# Patient Record
Sex: Male | Born: 1995 | Race: White | Hispanic: No | Marital: Single | Smoking: Current every day smoker
Health system: Southern US, Community
[De-identification: ages and names within clinical notes are randomized; demographics above are authoritative.]

## PROBLEM LIST (undated history)

## (undated) HISTORY — PX: WISDOM TOOTH EXTRACTION: SHX21

## (undated) HISTORY — PX: OTHER SURGICAL HISTORY: SHX169

---

## 2018-09-18 ENCOUNTER — Emergency Department (HOSPITAL_COMMUNITY): Payer: BLUE CROSS/BLUE SHIELD

## 2018-09-18 ENCOUNTER — Emergency Department (HOSPITAL_COMMUNITY)
Admission: EM | Admit: 2018-09-18 | Discharge: 2018-09-18 | Disposition: A | Discharge status: Home / Self Care | Payer: BLUE CROSS/BLUE SHIELD | Attending: Emergency Medicine | Admitting: Emergency Medicine

## 2018-09-18 ENCOUNTER — Encounter (HOSPITAL_COMMUNITY): Payer: Self-pay | Admitting: Physician Assistant

## 2018-09-18 DIAGNOSIS — S92321A Displaced fracture of second metatarsal bone, right foot, initial encounter for closed fracture: Secondary | ICD-10-CM | POA: Insufficient documentation

## 2018-09-18 DIAGNOSIS — S92251A Displaced fracture of navicular [scaphoid] of right foot, initial encounter for closed fracture: Secondary | ICD-10-CM | POA: Diagnosis not present

## 2018-09-18 DIAGNOSIS — S99921A Unspecified injury of right foot, initial encounter: Secondary | ICD-10-CM | POA: Diagnosis present

## 2018-09-18 DIAGNOSIS — Y9364 Activity, baseball: Secondary | ICD-10-CM | POA: Diagnosis not present

## 2018-09-18 DIAGNOSIS — S92331A Displaced fracture of third metatarsal bone, right foot, initial encounter for closed fracture: Secondary | ICD-10-CM | POA: Diagnosis not present

## 2018-09-18 DIAGNOSIS — Y999 Unspecified external cause status: Secondary | ICD-10-CM | POA: Insufficient documentation

## 2018-09-18 DIAGNOSIS — Y92320 Baseball field as the place of occurrence of the external cause: Secondary | ICD-10-CM | POA: Diagnosis not present

## 2018-09-18 DIAGNOSIS — X500XXA Overexertion from strenuous movement or load, initial encounter: Secondary | ICD-10-CM | POA: Diagnosis not present

## 2018-09-18 MED ORDER — ONDANSETRON HCL 4 MG/2ML IJ SOLN: 4.0000 mg | Freq: Once | INTRAMUSCULAR | Status: DC

## 2018-09-18 MED ORDER — ONDANSETRON HCL 4 MG PO TABS
4.0000 mg | ORAL_TABLET | Freq: Once | ORAL | Status: AC
  Administered 2018-09-18: 4 mg via ORAL
  Filled 2018-09-18: qty 1

## 2018-09-18 MED ORDER — OXYCODONE-ACETAMINOPHEN 5-325 MG PO TABS: 1.0000 | ORAL_TABLET | Freq: Four times a day (QID) | ORAL | 0 refills | Status: DC | PRN

## 2018-09-18 MED ORDER — ONDANSETRON 4 MG PO TBDP: 4.0000 mg | ORAL_TABLET | Freq: Three times a day (TID) | ORAL | 0 refills | Status: DC | PRN

## 2018-09-18 MED ORDER — OXYCODONE-ACETAMINOPHEN 5-325 MG PO TABS
1.0000 | ORAL_TABLET | Freq: Once | ORAL | Status: AC
  Administered 2018-09-18: 1 via ORAL
  Filled 2018-09-18: qty 1

## 2018-09-18 NOTE — ED Triage Notes (Signed)
Patient was playing base ball while he was running he fell on his foot.

## 2018-09-18 NOTE — Discharge Instructions (Addendum)
Please wear your splint at all times.  You may take it off only to shower and must be very careful not to put any weight on the foot at all times, especially when not wearing it to shower.  If you put weight on your foot it can be made worse.    Please take Ibuprofen (Advil, motrin) and Tylenol (acetaminophen) to relieve your pain.  You may take up to 600 MG (3 pills) of normal strength ibuprofen every 8 hours as needed.  In between doses of ibuprofen you make take tylenol, up to 1,000 mg (two extra strength pills).  Do not take more than 3,000 mg tylenol in a 24 hour period.  Please check all medication labels as many medications such as pain and cold medications may contain tylenol.  Do not drink alcohol while taking these medications.  Do not take other NSAID'S while taking ibuprofen (such as aleve or naproxen).  Please take ibuprofen with food to decrease stomach upset.  Today you received medications that may make you sleepy or impair your ability to make decisions.  For the next 24 hours please do not drive, operate heavy machinery, care for a small child with out another adult present, or perform any activities that may cause harm to you or someone else if you were to fall asleep or be impaired.   You are being prescribed a medication which may make you sleepy. Please follow up of listed precautions for at least 24 hours after taking one dose.  You may want to consider taking 1 baby aspirin (81 mg) once or twice a day to help decrease your risk of blood clots in your leg.  Please make sure that you let your surgeon know you have been taking aspirin.

## 2018-09-18 NOTE — ED Provider Notes (Signed)
[facility]-EMERGENCY DEPT Provider Note   CSN: [id number] Arrival date & time: 09/18/18  1202     History   Chief Complaint No chief complaint on file.   HPI Peter Holt is a 22 y.o. male who presents today for evaluation of a right foot injury.  He reports that he was playing baseball and rounding first base when he stepped funny and felt something crack in his foot.  He denies striking his head or passing out, no other injuries from this incident.  He has not injured this foot or ankle in the past.  He denies any numbness or tingling.  No interventions tried prior to arrival.  HPI  History reviewed. No pertinent past medical history.  There are no active problems to display for this patient.   History reviewed. No pertinent surgical history.      Home Medications    Prior to Admission medications   Medication Sig Start Date End Date Taking? Authorizing Provider  ondansetron (ZOFRAN ODT) 4 MG disintegrating tablet Take 1 tablet (4 mg total) by mouth every 8 (eight) hours as needed for nausea or vomiting. 09/18/18   Cristina Gong, PA-C  oxyCODONE-acetaminophen (PERCOCET/ROXICET) 5-325 MG tablet Take 1 tablet by mouth every 6 (six) hours as needed for severe pain. 09/18/18   Cristina Gong, PA-C    Family History History reviewed. No pertinent family history.  Social History Social History   Tobacco Use  . Smoking status: Not on file  Substance Use Topics  . Alcohol use: Not on file  . Drug use: Not on file     Allergies   Penicillins   Review of Systems Review of Systems  Constitutional: Negative for chills and fever.  HENT: Negative for congestion.   Respiratory: Negative for shortness of breath.   Musculoskeletal: Positive for joint swelling.       Right foot pain  Neurological: Negative for headaches.  All other systems reviewed and are negative.    Physical Exam Updated Vital Signs BP 121/80   Pulse 81   Temp  98.6 F (37 C) (Oral)   Resp 18   Ht 5\' 9"  (1.753 m)   Wt 90.7 kg   SpO2 100%   BMI 29.53 kg/m   Physical Exam  Constitutional: He appears well-developed. No distress.  HENT:  Head: Normocephalic.  Cardiovascular:  2+ PT pulses bilaterally, 2+ left DP pulse, unable to palpate right DP pulse secondary to edema, pain  Musculoskeletal:  Obvious deformity to right foot over the dorsum and medial aspects.  There is a large amount of edema without obvious ecchymosis.  He is able to wiggle his toes.  Neurological: He is alert.  Sensation intact to bilateral feet.  Skin: Skin is warm and dry. He is not diaphoretic.  No obvious breaks in the skin over the right foot  Nursing note and vitals reviewed.    ED Treatments / Results  Labs (all labs ordered are listed, but only abnormal results are displayed) Labs Reviewed - No data to display  EKG None  Radiology Dg Ankle Complete Right  Result Date: 09/18/2018 CLINICAL DATA:  Injured playing baseball.  Pain. EXAM: RIGHT ANKLE - COMPLETE 3+ VIEW COMPARISON:  None. FINDINGS: Comminuted fracture of medial aspect of the navicular. Medial subluxation of medial cuneiform relative to the navicular. No other fracture or dislocation. Soft tissue swelling along the dorsal aspect of the midfoot. IMPRESSION: 1. Comminuted fracture of medial aspect of the navicular. Medial subluxation  of medial cuneiform relative to the navicular. Electronically Signed   By: Elige Ko   On: 09/18/2018 13:32   Dg Foot Complete Right  Result Date: 09/18/2018 CLINICAL DATA:  Deformity of the foot. Pain and swelling. Injured at baseball game. EXAM: RIGHT FOOT COMPLETE - 3+ VIEW COMPARISON:  None. FINDINGS: Acute displaced fractures of the right second and third metatarsal necks with medial displacement. Comminuted fracture of medial aspect of the navicular. Medial subluxation of medial cuneiform relative to the navicular. No other fracture or dislocation. Soft tissue  swelling along the dorsal aspect of the midfoot. IMPRESSION: 1. Acute displaced fractures of the right second and third metatarsal necks with medial displacement. 2. Comminuted fracture of medial aspect of the navicular. Medial subluxation of medial cuneiform relative to the navicular. Electronically Signed   By: Elige Ko   On: 09/18/2018 13:30    Procedures Procedures (including critical care time)  Medications Ordered in ED Medications  oxyCODONE-acetaminophen (PERCOCET/ROXICET) 5-325 MG per tablet 1 tablet (1 tablet Oral Given 09/18/18 1313)  ondansetron (ZOFRAN) tablet 4 mg (4 mg Oral Given 09/18/18 1313)     Initial Impression / Assessment and Plan / ED Course  I have reviewed the triage vital signs and the nursing notes.  Pertinent labs & imaging results that were available during my care of the patient were reviewed by me and considered in my medical decision making (see chart for details).    Patient presents today for evaluation of right foot injury that occurred while playing baseball.  Denies any other injuries.  Obvious edema to the dorsal and medial aspect of the right foot.  X-rays were obtained showing fractures of the distal neck of the second and third metatarsals, a comminuted navicular fracture with concern for widening of the space between the first and second metatarsals.  He does not have any skin breaks over the area, foot is neurovascularly intact.  Discussed placing a splint with patient, states that he would much prefer a cam walker so that he can still shower.  Agreed to this, however gave patient very strict instructions regarding nonweightbearing status.  He is given crutches.  Ibuprofen Tylenol along with Percocet for pain.  PMP shows no prescriptions for narcotics this calendar year.    Patient given orthopedics follow-up, instructed to call their office tomorrow morning for an appointment.  Return precautions were discussed with patient who states their  understanding.  At the time of discharge patient denied any unaddressed complaints or concerns.  Patient is agreeable for discharge home.   Final Clinical Impressions(s) / ED Diagnoses   Final diagnoses:  Closed displaced fracture of second metatarsal bone of right foot, initial encounter  Closed displaced fracture of third metatarsal bone of right foot, initial encounter  Closed displaced fracture of navicular bone of right foot, initial encounter    ED Discharge Orders         Ordered    ondansetron (ZOFRAN ODT) 4 MG disintegrating tablet  Every 8 hours PRN     09/18/18 1409    oxyCODONE-acetaminophen (PERCOCET/ROXICET) 5-325 MG tablet  Every 6 hours PRN     09/18/18 1409           Cristina Gong, PA-C 09/18/18 1647    Virgina Norfolk, DO 09/18/18 1858

## 2018-09-19 ENCOUNTER — Other Ambulatory Visit: Payer: Self-pay | Admitting: Orthopedic Surgery

## 2018-09-19 ENCOUNTER — Ambulatory Visit
Admission: RE | Admit: 2018-09-19 | Discharge: 2018-09-19 | Disposition: A | Discharge status: Home / Self Care | Payer: BLUE CROSS/BLUE SHIELD | Source: Ambulatory Visit | Attending: Orthopedic Surgery | Admitting: Orthopedic Surgery

## 2018-09-19 DIAGNOSIS — S92301A Fracture of unspecified metatarsal bone(s), right foot, initial encounter for closed fracture: Secondary | ICD-10-CM

## 2018-09-19 DIAGNOSIS — T148XXA Other injury of unspecified body region, initial encounter: Secondary | ICD-10-CM

## 2018-09-21 ENCOUNTER — Encounter (HOSPITAL_BASED_OUTPATIENT_CLINIC_OR_DEPARTMENT_OTHER): Payer: Self-pay | Admitting: *Deleted

## 2018-09-21 ENCOUNTER — Other Ambulatory Visit: Payer: Self-pay

## 2018-09-21 ENCOUNTER — Other Ambulatory Visit: Payer: Self-pay | Admitting: Orthopaedic Surgery

## 2018-09-22 ENCOUNTER — Other Ambulatory Visit: Payer: BLUE CROSS/BLUE SHIELD

## 2018-09-26 ENCOUNTER — Encounter (HOSPITAL_BASED_OUTPATIENT_CLINIC_OR_DEPARTMENT_OTHER): Payer: Self-pay

## 2018-09-26 NOTE — Anesthesia Preprocedure Evaluation (Addendum)
Anesthesia Evaluation  Patient identified by MRN, date of birth, ID band Patient awake    Reviewed: Allergy & Precautions, NPO status , Patient's Chart, lab work & pertinent test results  History of Anesthesia Complications Negative for: history of anesthetic complications  Airway Mallampati: II  TM Distance: >3 FB Neck ROM: Full    Dental  (+) Dental Advisory Given, Teeth Intact   Pulmonary Current Smoker,    breath sounds clear to auscultation       Cardiovascular negative cardio ROS   Rhythm:Regular Rate:Normal     Neuro/Psych negative neurological ROS  negative psych ROS   GI/Hepatic negative GI ROS, Neg liver ROS,   Endo/Other  negative endocrine ROS  Renal/GU negative Renal ROS  negative genitourinary   Musculoskeletal negative musculoskeletal ROS (+)   Abdominal   Peds  Hematology negative hematology ROS (+)   Anesthesia Other Findings   Reproductive/Obstetrics                            Anesthesia Physical Anesthesia Plan  ASA: II  Anesthesia Plan: General   Post-op Pain Management:  Regional for Post-op pain   Induction: Intravenous  PONV Risk Score and Plan: 2 and Treatment may vary due to age or medical condition, Ondansetron, Dexamethasone and Midazolam  Airway Management Planned: LMA  Additional Equipment: None  Intra-op Plan:   Post-operative Plan: Extubation in OR  Informed Consent: I have reviewed the patients History and Physical, chart, labs and discussed the procedure including the risks, benefits and alternatives for the proposed anesthesia with the patient or authorized representative who has indicated his/her understanding and acceptance.   Dental advisory given  Plan Discussed with: CRNA and Anesthesiologist  Anesthesia Plan Comments:        Anesthesia Quick Evaluation  

## 2018-09-27 ENCOUNTER — Encounter (HOSPITAL_BASED_OUTPATIENT_CLINIC_OR_DEPARTMENT_OTHER)
Admission: RE | Disposition: A | Discharge status: Home / Self Care | Payer: Self-pay | Source: Ambulatory Visit | Attending: Orthopaedic Surgery

## 2018-09-27 ENCOUNTER — Ambulatory Visit (HOSPITAL_BASED_OUTPATIENT_CLINIC_OR_DEPARTMENT_OTHER)
Admission: RE | Admit: 2018-09-27 | Discharge: 2018-09-27 | Disposition: A | Discharge status: Home / Self Care | Payer: BLUE CROSS/BLUE SHIELD | Source: Ambulatory Visit | Attending: Orthopaedic Surgery | Admitting: Orthopaedic Surgery

## 2018-09-27 ENCOUNTER — Ambulatory Visit (HOSPITAL_BASED_OUTPATIENT_CLINIC_OR_DEPARTMENT_OTHER): Payer: BLUE CROSS/BLUE SHIELD | Admitting: Anesthesiology

## 2018-09-27 ENCOUNTER — Other Ambulatory Visit: Payer: Self-pay

## 2018-09-27 ENCOUNTER — Encounter (HOSPITAL_BASED_OUTPATIENT_CLINIC_OR_DEPARTMENT_OTHER): Payer: Self-pay | Admitting: Certified Registered"

## 2018-09-27 DIAGNOSIS — S92321A Displaced fracture of second metatarsal bone, right foot, initial encounter for closed fracture: Secondary | ICD-10-CM | POA: Diagnosis not present

## 2018-09-27 DIAGNOSIS — Y9364 Activity, baseball: Secondary | ICD-10-CM | POA: Diagnosis not present

## 2018-09-27 DIAGNOSIS — Z880 Allergy status to penicillin: Secondary | ICD-10-CM | POA: Diagnosis not present

## 2018-09-27 DIAGNOSIS — S93314A Dislocation of tarsal joint of right foot, initial encounter: Secondary | ICD-10-CM | POA: Insufficient documentation

## 2018-09-27 DIAGNOSIS — S92331A Displaced fracture of third metatarsal bone, right foot, initial encounter for closed fracture: Secondary | ICD-10-CM | POA: Diagnosis not present

## 2018-09-27 DIAGNOSIS — Z7982 Long term (current) use of aspirin: Secondary | ICD-10-CM | POA: Insufficient documentation

## 2018-09-27 DIAGNOSIS — Z79899 Other long term (current) drug therapy: Secondary | ICD-10-CM | POA: Diagnosis not present

## 2018-09-27 DIAGNOSIS — S92241A Displaced fracture of medial cuneiform of right foot, initial encounter for closed fracture: Secondary | ICD-10-CM | POA: Diagnosis not present

## 2018-09-27 DIAGNOSIS — F17210 Nicotine dependence, cigarettes, uncomplicated: Secondary | ICD-10-CM | POA: Insufficient documentation

## 2018-09-27 DIAGNOSIS — S92251A Displaced fracture of navicular [scaphoid] of right foot, initial encounter for closed fracture: Secondary | ICD-10-CM | POA: Diagnosis not present

## 2018-09-27 HISTORY — PX: OPEN REDUCTION INTERNAL FIXATION (ORIF) FOOT LISFRANC FRACTURE: SHX5990

## 2018-09-27 SURGERY — OPEN REDUCTION INTERNAL FIXATION (ORIF) FOOT LISFRANC FRACTURE
Anesthesia: General | Site: Foot | Laterality: Right

## 2018-09-27 MED ORDER — 0.9 % SODIUM CHLORIDE (POUR BTL) OPTIME
TOPICAL | Status: DC | PRN
  Administered 2018-09-27: 1000 mL

## 2018-09-27 MED ORDER — SCOPOLAMINE 1 MG/3DAYS TD PT72: 1.0000 | MEDICATED_PATCH | Freq: Once | TRANSDERMAL | Status: DC | PRN

## 2018-09-27 MED ORDER — IBUPROFEN 200 MG PO TABS: 600.0000 mg | ORAL_TABLET | Freq: Four times a day (QID) | ORAL | 0 refills | Status: DC | PRN

## 2018-09-27 MED ORDER — BUPIVACAINE-EPINEPHRINE (PF) 0.5% -1:200000 IJ SOLN
INTRAMUSCULAR | Status: DC | PRN
  Administered 2018-09-27: 30 mL via PERINEURAL

## 2018-09-27 MED ORDER — FENTANYL CITRATE (PF) 100 MCG/2ML IJ SOLN
INTRAMUSCULAR | Status: AC
  Filled 2018-09-27: qty 2

## 2018-09-27 MED ORDER — PROPOFOL 10 MG/ML IV BOLUS
INTRAVENOUS | Status: DC | PRN
  Administered 2018-09-27: 300 mg via INTRAVENOUS

## 2018-09-27 MED ORDER — BUPIVACAINE-EPINEPHRINE (PF) 0.25% -1:200000 IJ SOLN
INTRAMUSCULAR | Status: AC
  Filled 2018-09-27: qty 30

## 2018-09-27 MED ORDER — LACTATED RINGERS IV SOLN
INTRAVENOUS | Status: DC
  Administered 2018-09-27 (×2): via INTRAVENOUS

## 2018-09-27 MED ORDER — PROPOFOL 500 MG/50ML IV EMUL
INTRAVENOUS | Status: DC | PRN
  Administered 2018-09-27: 100 ug/kg/min via INTRAVENOUS

## 2018-09-27 MED ORDER — FENTANYL CITRATE (PF) 100 MCG/2ML IJ SOLN
50.0000 ug | INTRAMUSCULAR | Status: DC | PRN
  Administered 2018-09-27: 50 ug via INTRAVENOUS
  Administered 2018-09-27: 25 ug via INTRAVENOUS

## 2018-09-27 MED ORDER — CLINDAMYCIN PHOSPHATE 900 MG/50ML IV SOLN
900.0000 mg | INTRAVENOUS | Status: AC
  Administered 2018-09-27: 900 mg via INTRAVENOUS

## 2018-09-27 MED ORDER — ONDANSETRON HCL 4 MG/2ML IJ SOLN
INTRAMUSCULAR | Status: DC | PRN
  Administered 2018-09-27: 4 mg via INTRAVENOUS

## 2018-09-27 MED ORDER — LIDOCAINE HCL (CARDIAC) PF 100 MG/5ML IV SOSY
PREFILLED_SYRINGE | INTRAVENOUS | Status: DC | PRN
  Administered 2018-09-27: 30 mg via INTRAVENOUS

## 2018-09-27 MED ORDER — OXYCODONE HCL 5 MG PO TABS
ORAL_TABLET | ORAL | Status: AC
  Filled 2018-09-27: qty 1

## 2018-09-27 MED ORDER — CLINDAMYCIN PHOSPHATE 900 MG/50ML IV SOLN
INTRAVENOUS | Status: AC
  Filled 2018-09-27: qty 50

## 2018-09-27 MED ORDER — FENTANYL CITRATE (PF) 100 MCG/2ML IJ SOLN: 25.0000 ug | INTRAMUSCULAR | Status: DC | PRN

## 2018-09-27 MED ORDER — PROPOFOL 10 MG/ML IV BOLUS
INTRAVENOUS | Status: AC
  Filled 2018-09-27: qty 20

## 2018-09-27 MED ORDER — OXYCODONE HCL 5 MG/5ML PO SOLN: 5.0000 mg | Freq: Once | ORAL | Status: AC | PRN

## 2018-09-27 MED ORDER — PROMETHAZINE HCL 25 MG/ML IJ SOLN: 6.2500 mg | INTRAMUSCULAR | Status: DC | PRN

## 2018-09-27 MED ORDER — OXYCODONE HCL 5 MG PO TABS
5.0000 mg | ORAL_TABLET | Freq: Once | ORAL | Status: AC | PRN
  Administered 2018-09-27: 5 mg via ORAL

## 2018-09-27 MED ORDER — DEXAMETHASONE SODIUM PHOSPHATE 10 MG/ML IJ SOLN
INTRAMUSCULAR | Status: DC | PRN
  Administered 2018-09-27: 10 mg via INTRAVENOUS

## 2018-09-27 MED ORDER — MIDAZOLAM HCL 2 MG/2ML IJ SOLN
1.0000 mg | INTRAMUSCULAR | Status: DC | PRN
  Administered 2018-09-27: 2 mg via INTRAVENOUS

## 2018-09-27 MED ORDER — OXYCODONE HCL 5 MG PO TABS: 5.0000 mg | ORAL_TABLET | ORAL | 0 refills | Status: AC | PRN

## 2018-09-27 MED ORDER — MIDAZOLAM HCL 2 MG/2ML IJ SOLN
INTRAMUSCULAR | Status: AC
  Filled 2018-09-27: qty 2

## 2018-09-27 MED ORDER — PHENYLEPHRINE 40 MCG/ML (10ML) SYRINGE FOR IV PUSH (FOR BLOOD PRESSURE SUPPORT)
PREFILLED_SYRINGE | INTRAVENOUS | Status: AC
  Filled 2018-09-27: qty 10

## 2018-09-27 MED ORDER — PHENYLEPHRINE 40 MCG/ML (10ML) SYRINGE FOR IV PUSH (FOR BLOOD PRESSURE SUPPORT)
PREFILLED_SYRINGE | INTRAVENOUS | Status: DC | PRN
  Administered 2018-09-27: 40 ug via INTRAVENOUS
  Administered 2018-09-27: 80 ug via INTRAVENOUS

## 2018-09-27 SURGICAL SUPPLY — 65 items
BANDAGE ACE 4X5 VEL STRL LF (GAUZE/BANDAGES/DRESSINGS) ×3 IMPLANT
BANDAGE ACE 6X5 VEL STRL LF (GAUZE/BANDAGES/DRESSINGS) ×3 IMPLANT
BANDAGE COBAN STERILE 2 (GAUZE/BANDAGES/DRESSINGS) ×3 IMPLANT
BANDAGE ESMARK 6X9 LF (GAUZE/BANDAGES/DRESSINGS) IMPLANT
BENZOIN TINCTURE PRP APPL 2/3 (GAUZE/BANDAGES/DRESSINGS) IMPLANT
BIT DRILL 2 CANN SM BONE QF (BIT) ×3 IMPLANT
BIT DRILL 3.5 CANN STRL (BIT) ×3 IMPLANT
BLADE SURG 15 STRL LF DISP TIS (BLADE) ×4 IMPLANT
BLADE SURG 15 STRL SS (BLADE) ×8
BNDG COHESIVE 4X5 TAN STRL (GAUZE/BANDAGES/DRESSINGS) IMPLANT
BNDG ESMARK 6X9 LF (GAUZE/BANDAGES/DRESSINGS)
CHLORAPREP W/TINT 26ML (MISCELLANEOUS) ×3 IMPLANT
CLOSURE WOUND 1/2 X4 (GAUZE/BANDAGES/DRESSINGS)
COVER BACK TABLE 60X90IN (DRAPES) ×3 IMPLANT
COVER WAND RF STERILE (DRAPES) ×3 IMPLANT
CUFF TOURNIQUET SINGLE 34IN LL (TOURNIQUET CUFF) IMPLANT
DECANTER SPIKE VIAL GLASS SM (MISCELLANEOUS) IMPLANT
DRAPE EXTREMITY T 121X128X90 (DRAPE) ×3 IMPLANT
DRAPE IMP U-DRAPE 54X76 (DRAPES) ×3 IMPLANT
DRAPE OEC MINIVIEW 54X84 (DRAPES) ×3 IMPLANT
DRAPE U-SHAPE 47X51 STRL (DRAPES) ×3 IMPLANT
ELECT REM PT RETURN 9FT ADLT (ELECTROSURGICAL) ×3
ELECTRODE REM PT RTRN 9FT ADLT (ELECTROSURGICAL) ×1 IMPLANT
GAUZE SPONGE 4X4 12PLY STRL (GAUZE/BANDAGES/DRESSINGS) ×3 IMPLANT
GAUZE XEROFORM 1X8 LF (GAUZE/BANDAGES/DRESSINGS) ×3 IMPLANT
GLOVE BIO SURGEON STRL SZ7.5 (GLOVE) ×3 IMPLANT
GLOVE BIOGEL PI IND STRL 7.0 (GLOVE) ×2 IMPLANT
GLOVE BIOGEL PI IND STRL 8 (GLOVE) ×1 IMPLANT
GLOVE BIOGEL PI INDICATOR 7.0 (GLOVE) ×4
GLOVE BIOGEL PI INDICATOR 8 (GLOVE) ×2
GLOVE ECLIPSE 6.5 STRL STRAW (GLOVE) ×3 IMPLANT
GOWN STRL REUS W/ TWL LRG LVL3 (GOWN DISPOSABLE) ×1 IMPLANT
GOWN STRL REUS W/ TWL XL LVL3 (GOWN DISPOSABLE) ×1 IMPLANT
GOWN STRL REUS W/TWL LRG LVL3 (GOWN DISPOSABLE) ×2
GOWN STRL REUS W/TWL XL LVL3 (GOWN DISPOSABLE) ×2
K-WIRE THREAD TROCAR TIP .045 (WIRE) ×9
KWIRE THREAD TROCAR TIP .045 (WIRE) ×3 IMPLANT
NS IRRIG 1000ML POUR BTL (IV SOLUTION) ×3 IMPLANT
PACK BASIN DAY SURGERY FS (CUSTOM PROCEDURE TRAY) ×3 IMPLANT
PAD CAST 4YDX4 CTTN HI CHSV (CAST SUPPLIES) ×1 IMPLANT
PADDING CAST COTTON 4X4 STRL (CAST SUPPLIES) ×2
PADDING CAST SYNTHETIC 4 (CAST SUPPLIES) ×2
PADDING CAST SYNTHETIC 4X4 STR (CAST SUPPLIES) ×1 IMPLANT
PENCIL BUTTON HOLSTER BLD 10FT (ELECTRODE) ×3 IMPLANT
SCREW LP TIT 3.5X32 (Screw) ×3 IMPLANT
SCREW LP TIT 3.5X34 (Screw) ×3 IMPLANT
SCREW LP TITANIUM 3.5X40 (Screw) ×3 IMPLANT
SCREW QCKFIX 3.0X28 (Screw) ×3 IMPLANT
SLEEVE SCD COMPRESS KNEE MED (MISCELLANEOUS) ×3 IMPLANT
SPLINT FIBERGLASS 4X30 (CAST SUPPLIES) ×6 IMPLANT
SPONGE LAP 18X18 RF (DISPOSABLE) ×3 IMPLANT
STOCKINETTE 6  STRL (DRAPES) ×2
STOCKINETTE 6 STRL (DRAPES) ×1 IMPLANT
STRIP CLOSURE SKIN 1/2X4 (GAUZE/BANDAGES/DRESSINGS) IMPLANT
SUCTION FRAZIER HANDLE 10FR (MISCELLANEOUS) ×2
SUCTION TUBE FRAZIER 10FR DISP (MISCELLANEOUS) ×1 IMPLANT
SUT ETHILON 3 0 PS 1 (SUTURE) ×3 IMPLANT
SUT MNCRL AB 3-0 PS2 18 (SUTURE) ×3 IMPLANT
SUT PDS AB 2-0 CT2 27 (SUTURE) ×3 IMPLANT
SUT VIC AB 3-0 FS2 27 (SUTURE) IMPLANT
SYR BULB 3OZ (MISCELLANEOUS) ×3 IMPLANT
TOWEL GREEN STERILE FF (TOWEL DISPOSABLE) ×9 IMPLANT
TUBE CONNECTING 20'X1/4 (TUBING) ×1
TUBE CONNECTING 20X1/4 (TUBING) ×2 IMPLANT
UNDERPAD 30X30 (UNDERPADS AND DIAPERS) ×3 IMPLANT

## 2018-09-27 NOTE — Anesthesia Procedure Notes (Signed)
Procedure Name: LMA Insertion Date/Time: 09/27/2018 7:36 AM Performed by: Sheryn Bison, CRNA Pre-anesthesia Checklist: Patient identified, Emergency Drugs available, Suction available and Patient being monitored Patient Re-evaluated:Patient Re-evaluated prior to induction Oxygen Delivery Method: Circle system utilized Preoxygenation: Pre-oxygenation with 100% oxygen Induction Type: IV induction Ventilation: Mask ventilation without difficulty LMA: LMA inserted LMA Size: 4.0 Number of attempts: 1 Airway Equipment and Method: Bite block Placement Confirmation: positive ETCO2 Tube secured with: Tape Dental Injury: Teeth and Oropharynx as per pre-operative assessment

## 2018-09-27 NOTE — H&P (Signed)
Peter Holt is an 22 y.o. male.   Chief Complaint: Right Lisfranc fracture dislocation, second third metatarsal neck fractures, navicular fracture. HPI: Peter Holt is here today with the above injury.  As you recall he was running the bases during a baseball game and stepped incorrectly and had immediate pain and swelling in his foot.  He was unable to ambulate just afterward.  He was seen at an outside facility and diagnosed with the above injuries.  I saw him in clinic 1 week ago he is been home elevating his foot.  His pain has subsided somewhat.  He denies any other joint or extremity pain today.  History reviewed. No pertinent past medical history.  Past Surgical History:  Procedure Laterality Date  . Pilondial cyst    . WISDOM TOOTH EXTRACTION      History reviewed. No pertinent family history. Social History:  reports that he has been smoking cigarettes and e-cigarettes. He has never used smokeless tobacco. He reports that he drinks alcohol. He reports that he does not use drugs.  Allergies:  Allergies  Allergen Reactions  . Penicillins Rash    Medications Prior to Admission  Medication Sig Dispense Refill  . acetaminophen (TYLENOL) 325 MG tablet Take 650 mg by mouth every 6 (six) hours as needed.    . ASPIRIN 81 PO Take by mouth.    Marland Kitchen ibuprofen (ADVIL,MOTRIN) 200 MG tablet Take 200 mg by mouth every 6 (six) hours as needed.    . ondansetron (ZOFRAN ODT) 4 MG disintegrating tablet Take 1 tablet (4 mg total) by mouth every 8 (eight) hours as needed for nausea or vomiting. 10 tablet 0  . oxyCODONE-acetaminophen (PERCOCET/ROXICET) 5-325 MG tablet Take 1 tablet by mouth every 6 (six) hours as needed for severe pain. 12 tablet 0  . senna (SENOKOT) 8.6 MG tablet Take 1 tablet by mouth daily.      No results found for this or any previous visit (from the past 48 hour(s)). No results found.  Review of Systems  Constitutional: Negative.   HENT: Negative.   Eyes: Negative.   Respiratory:  Negative.   Cardiovascular: Negative.   Gastrointestinal: Negative.   Musculoskeletal:       Right foot pain and swelling.  Notes pain on the plantar aspect of his forefoot as well.  Skin: Negative.   Neurological: Negative.   Psychiatric/Behavioral: Negative.     Blood pressure (!) 152/90, pulse 74, temperature 97.7 F (36.5 C), temperature source Oral, resp. rate 18, height 5\' 9"  (1.753 m), weight 91.6 kg, SpO2 100 %. Physical Exam  Constitutional: He is oriented to person, place, and time. He appears well-developed.  HENT:  Head: Normocephalic.  Eyes: Conjunctivae are normal.  Neck: Neck supple.  Cardiovascular: Normal rate.  Respiratory: Effort normal.  GI: Soft.  Musculoskeletal:  Right leg in a splint.  Toes exposed are swollen and ecchymotic.  He is able to wiggle his toes.  He endorses sensation over the toes.  No pain to palpation proximal to the splint.  No knee pain.  Neurological: He is alert and oriented to person, place, and time.  Skin: Skin is warm and dry.  Psychiatric: He has a normal mood and affect. His behavior is normal.     Assessment/Plan We will plan for open reduction internal fixation of his Lisfranc fracture dislocation.  He will likely need intercuneiform fixation as well given the medial column dislocation.  He will need treatment of his navicular fracture as well.  This  will likely be in the form of some fragment excision.  He will be discharged from the PACU.  He will be nonweightbearing on the right lower extremity.  Terance Hart, MD 09/27/2018, 7:08 AM

## 2018-09-27 NOTE — Op Note (Addendum)
Peter Holt male 22 y.o. 09/27/2018  PreOperative Diagnosis: Right Lisfranc fracture dislocation Right medial and middle intercuneiform dislocation Right medial naviculocuneiform dislocation Comminuted Navicular fracture Second metatarsal neck fracture Third metatarsal neck fracture  PostOperative Diagnosis: Right Lisfranc fracture dislocation Right medial and middle intercuneiform dislocation Right medial naviculocuneiform dislocation Comminuted Navicular fracture Second metatarsal neck fracture Third metatarsal neck fracture Medial cuneiform impaction fracture Middle cuneiform impaction fracture   Procedure(s) and Anesthesia Type: Open treatment of Lisfranc fracture dislocation with internal fixation - 10960 Open treatment of intercuneiform dislocation with internal fixation - 45409 Open treatment of navicular fracture - 81191 Partial excision of navicular - 47829 Closed treatment of second metatarsal neck fracture - 56213 Closed treatment of third metatarsal neck fracture - 08657  General LMA with regional block  Surgeon: Terance Hart   Assistants: None  Anesthesia: General LMA anesthesia  Findings: Medial column disruption with middle cuneiform impaction fracture and medial cuneiform impaction fracture and comminuted navicular pole fracture  Implants: Arthrex 3.5 mm fully threaded screws  Indications:22 y.o. male who was running bases playing baseball and stepped wrong injuring his right foot.  He had immediate pain and swelling and was unable to ambulate.  He was seen by an outside provider and diagnosed with second and third metatarsal neck fractures as well as a medial column fracture dislocation involving the Lisfranc joint, intercuneiform joint between the middle and medial and the naviculocuneiform joint with fracture of the navicular bone.  He was indicated for open treatment of these injuries.  After long discussion of the risks, benefits and  alternatives to the surgery he opted to proceed.  The risks discussed included but were not limited to wound healing complications, infection, nonunion or malunion, less than optimal outcome, progression of arthritis, need for second surgery, damage to surrounding structures.  Procedure Detail: Patient was seen in the preoperative holding area.  The right lower extremity was marked by myself and the patient.  The consent was verified and signed by myself and the patient.  Anesthesia performed popliteal block.  He was taken to the operating room and placed in the supine position.  General LMA anesthesia was induced without difficulty.  Antibiotic was given. All bony prominences were well-padded.  A bump was placed under the right hip.  The right lower extremity was prepped and draped in the usual sterile fashion.  Mini C-arm was used to identify the Lisfranc joint and the area of the dislocation and this was marked.  Then a 4 inch Esmarch tourniquet was placed at the ankle.  A 6 cm incision was made between the first and second metatarsal overlying the Lisfranc joint and the intercuneiform joint.  This was taken sharply down through skin and subcutaneous tissue.  Then blunt dissection was used to identify the EHL tendon and the neurovascular bundle which were retracted laterally.  Then the EDB muscle and tendon was identified and the sheath was opened and retracted as well.  The Lisfranc joint was identified and found to be completely disrupted.  The fracture hematoma was removed with curette and sharply with 15 blade.  Then the intercuneiform joint was identified and again found to be completely disrupted and shortened.  There was found to be impaction fractures of the medial cuneiform and the middle cuneiform proximally at the naviculocuneiform joint.  This was then taken back to the medial naviculocuneiform joint.  This was found to be disrupted and fractured as well.  There was significant comminution of the  medial  pole of the navicular.  Osteochondral fragments that were free-floating and unrepairable were excised.  Then the area of dislocation and fracture was irrigatedand removed of hematoma and devitalized soft tissues.  Then using pointed reduction forceps I was able to reduce the Lisfranc joint under direct visualization.  Then pointed reduction clamp was used to reduce and hold provisionally the intercuneiform joint.  This was acceptable in the distal fashion but given the impaction of the medial and middle cuneiforms there was some gapping proximally.  And also due to the severe comminution of the medial pole of navicular there was some gapping there.  The Lisfranc joint and distal cuneiform joints were acceptable.  Then K wires were placed across the Lisfranc joint and across the intercuneiform joint and checked on fluoroscopy to be in good position.  Then a 3.5 mm fully threaded screw was placed across the Lisfranc joint.  Then to 3.5 mm fully threaded screws were placed across the intercuneiform joint.  The fixation was robust and checked on fluoroscopy.  I then turned my attention to the navicular.  The remaining fracture fragments were deemed to be unfixable and were left in place to allow for some bony consolidation.  They were not interfering with the medial naviculocuneiform joint.  I then checked on intraoperative fluoroscopy the position of the second third metatarsal neck fractures and these were found to be acceptable.  I decided to treat these in a closed fashion.  The wound was then irrigated copiously with normal saline.  I let down the tourniquet.  I was able to palpate the dorsalis pedis pulse.  Hemostasis was obtained.  The deep tissue was closed with 2-0 PDS.  The subcutaneous tissue closed with 3-0 Monocryl in the skin with 3-0 nylon.  Xeroform and 4x4s were placed and then a short leg splint was placed.  All counts were correct at the end the case.  No comp occasions.  Post Op  Instructions: NWB RLE Keep splint dry - do not remove dressing 81 mg aspirin by mouth for DVT prophylaxis Call the office with concerns He will follow-up in 2 weeks for splint removal and x-ray.  Will assess for suture removal.  Tourniquette Time:1hr  Estimated Blood Loss:  less than 50 mL         Drains: none  Blood Given: none         Specimens: none       Complications:  * No complications entered in OR log *         Disposition: PACU - hemodynamically stable.         Condition: stable

## 2018-09-27 NOTE — Progress Notes (Signed)
Assisted Dr. Brock with right, ultrasound guided, popliteal block. Side rails up, monitors on throughout procedure. See vital signs in flow sheet. Tolerated Procedure well. 

## 2018-09-27 NOTE — Anesthesia Postprocedure Evaluation (Signed)
Anesthesia Post Note  Patient: Peter Holt  Procedure(s) Performed: OPEN REDUCTION INTERNAL FIXATION (ORIF) FOOT LISFRANC AND NAVICULAR FRACTURES (Right Foot)     Patient location during evaluation: PACU Anesthesia Type: General Level of consciousness: awake and alert Pain management: pain level controlled Vital Signs Assessment: post-procedure vital signs reviewed and stable Respiratory status: spontaneous breathing, nonlabored ventilation and respiratory function stable Cardiovascular status: blood pressure returned to baseline and stable Postop Assessment: no apparent nausea or vomiting Anesthetic complications: no    Last Vitals:  Vitals:   09/27/18 1045 09/27/18 1047  BP:  120/70  Pulse: 92 92  Resp: 15 16  Temp:    SpO2: 96% 97%    Last Pain:  Vitals:   09/27/18 1047  TempSrc:   PainSc: 3                  Beryle Lathe

## 2018-09-27 NOTE — Discharge Instructions (Signed)
Do not take Oxycodone until 3:15pm today  [facility] POST-OP INSTRUCTIONS   Pain Management 1. The numbing medicine and your leg will last around 18 hours, take a dose of your pain medicine as soon as you feel it wearing off to avoid rebound pain. 2. Keep your foot elevated above heart level.  Make sure that your heel hangs free ('floats'). 3. Take all prescribed medication as directed. 4. If taking narcotic pain medication you may want to use an over-the-counter stool softener to avoid constipation. 5. You may take over-the-counter NSAIDs (ibuprofen, naproxen, etc.) as well as over-the-counter acetaminophen as directed on the packaging in addition to your narcotic pain prescription.  Activity  ? Non-weightbearing   First Postoperative Visit 1. Your first postop visit will be at least 2 weeks after surgery.  This should be scheduled when you schedule surgery. 2. If you do not have a postoperative visit scheduled please call [phone] to schedule an appointment. 3. At the appointment your incision will be evaluated for suture removal, x-rays will be obtained if necessary.  General Instructions 1. Swelling is very common after foot and ankle surgery.  It often takes 3 months for the foot and ankle to begin to feel comfortable.  Some amount of swelling will persist for 6-12 months. 2. DO NOT change the dressing.  If there is a problem with the dressing (too tight, loose, gets wet, etc.) please contact Dr. Donnie Mesa office. 3. DO NOT get the dressing wet.  For showers you can use an over-the-counter cast cover or wrap a washcloth around the top of your dressing and then cover it with a plastic bag and tape it to your leg. 4. DO NOT soak the incision (no tubs, pools, bath, etc.) until you have approval from Dr. Susa Simmonds.  Contact Dr. Garret Reddish office or go to Emergency Room if: 1. Temperature above 101 F. 2. Increasing pain that is unresponsive to pain medication or  elevation 3. Excessive redness or swelling in your foot 4. Dressing problems - excessive bloody drainage, looseness or tightness, or if dressing gets wet 5. Develop pain, swelling, warmth, or discoloration of your calf    Post Anesthesia Home Care Instructions  Activity: Get plenty of rest for the remainder of the day. A responsible individual must stay with you for 24 hours following the procedure.  For the next 24 hours, DO NOT: -Drive a car -Advertising copywriter -Drink alcoholic beverages -Take any medication unless instructed by your physician -Make any legal decisions or sign important papers.  Meals: Start with liquid foods such as gelatin or soup. Progress to regular foods as tolerated. Avoid greasy, spicy, heavy foods. If nausea and/or vomiting occur, drink only clear liquids until the nausea and/or vomiting subsides. Call your physician if vomiting continues.  Special Instructions/Symptoms: Your throat may feel dry or sore from the anesthesia or the breathing tube placed in your throat during surgery. If this causes discomfort, gargle with warm salt water. The discomfort should disappear within 24 hours.  If you had a scopolamine patch placed behind your ear for the management of post- operative nausea and/or vomiting:  1. The medication in the patch is effective for 72 hours, after which it should be removed.  Wrap patch in a tissue and discard in the trash. Wash hands thoroughly with soap and water. 2. You may remove the patch earlier than 72 hours if you experience unpleasant side effects which may include dry mouth, dizziness or visual disturbances. 3.  Avoid touching the patch. Wash your hands with soap and water after contact with the patch.    Regional Anesthesia Blocks  1. Numbness or the inability to move the "blocked" extremity may last from 3-48 hours after placement. The length of time depends on the medication injected and your individual response to the medication.  If the numbness is not going away after 48 hours, call your surgeon.  2. The extremity that is blocked will need to be protected until the numbness is gone and the  Strength has returned. Because you cannot feel it, you will need to take extra care to avoid injury. Because it may be weak, you may have difficulty moving it or using it. You may not know what position it is in without looking at it while the block is in effect.  3. For blocks in the legs and feet, returning to weight bearing and walking needs to be done carefully. You will need to wait until the numbness is entirely gone and the strength has returned. You should be able to move your leg and foot normally before you try and bear weight or walk. You will need someone to be with you when you first try to ensure you do not fall and possibly risk injury.  4. Bruising and tenderness at the needle site are common side effects and will resolve in a few days.  5. Persistent numbness or new problems with movement should be communicated to the surgeon or the [facility] [phone] [facility] [phone]).

## 2018-09-27 NOTE — Anesthesia Procedure Notes (Signed)
Anesthesia Regional Block: Popliteal block   Pre-Anesthetic Checklist: ,, timeout performed, Correct Patient, Correct Site, Correct Laterality, Correct Procedure, Correct Position, site marked, Risks and benefits discussed,  Surgical consent,  Pre-op evaluation,  At surgeon's request and post-op pain management  Laterality: Right  Prep: chloraprep       Needles:  Injection technique: Single-shot  Needle Type: Echogenic Needle     Needle Length: 10cm  Needle Gauge: 21     Additional Needles:   Narrative:  Start time: 09/27/2018 7:08 AM End time: 09/27/2018 7:12 AM Injection made incrementally with aspirations every 5 mL.  Performed by: Personally  Anesthesiologist: Beryle Lathe, MD  Additional Notes: No pain on injection. No increased resistance to injection. Injection made in 5cc increments. Good needle visualization. Patient tolerated the procedure well.

## 2018-09-27 NOTE — Transfer of Care (Signed)
Immediate Anesthesia Transfer of Care Note  Patient: Peter Holt  Procedure(s) Performed: OPEN REDUCTION INTERNAL FIXATION (ORIF) FOOT LISFRANC AND NAVICULAR FRACTURES (Right Foot)  Patient Location: PACU  Anesthesia Type:GA combined with regional for post-op pain  Level of Consciousness: drowsy and patient cooperative  Airway & Oxygen Therapy: Patient Spontanous Breathing and Patient connected to face mask oxygen  Post-op Assessment: Report given to RN and Post -op Vital signs reviewed and stable  Post vital signs: Reviewed and stable  Last Vitals:  Vitals Value Taken Time  BP    Temp    Pulse 75 09/27/2018 10:19 AM  Resp 12 09/27/2018 10:19 AM  SpO2 99 % 09/27/2018 10:19 AM  Vitals shown include unvalidated device data.  Last Pain:  Vitals:   09/27/18 0636  TempSrc: Oral  PainSc: 4       Patients Stated Pain Goal: 2 (09/27/18 0636)  Complications: No apparent anesthesia complications

## 2018-10-04 ENCOUNTER — Encounter (HOSPITAL_BASED_OUTPATIENT_CLINIC_OR_DEPARTMENT_OTHER): Payer: Self-pay | Admitting: Orthopaedic Surgery

## 2019-03-01 ENCOUNTER — Other Ambulatory Visit: Payer: Self-pay | Admitting: Orthopaedic Surgery

## 2019-03-10 NOTE — Pre-Procedure Instructions (Signed)
Peter Holt  03/10/2019      [facility] DRUG STORE #[id number] [name], [place] - [address] AT [facility] OF [facility] & [address] [place] [address] Phone: [phone] Fax: [phone]  [facility] DRUG STORE #[id number] [name], [place] - [address] AT [facility] OF [address] & [place] [address] [place] [address] Phone: [phone] Fax: [phone]    Your procedure is scheduled on Tuesday, March 31st.  Report to [facility] Entrance "A" Admitting at 5:30 A.M.  Call this number if you have problems the morning of surgery:  [phone]   Remember:  Do not eat or drink after midnight.    Take these medicines the morning of surgery with A SIP OF WATER: NONE  7 days prior to surgery STOP taking any Aspirin (unless otherwise instructed by your surgeon), Aleve, Naproxen, Ibuprofen, Motrin, Advil, Goody's, BC's, all herbal medications, fish oil, and all vitamins.    Do not wear jewelry.  Do not wear lotions, powders, or colognes, or deodorant.  Men may shave face and neck.  Do not bring valuables to the hospital.  [facility] is not responsible for any belongings or valuables.  Contacts, dentures or bridgework may not be worn into surgery.  Leave your suitcase in the car.  After surgery it may be brought to your room.  For patients admitted to the hospital, discharge time will be determined by your treatment team.  Patients discharged the day of surgery will not be allowed to drive home.   Special instructions:   [place]- Preparing For Surgery  Before surgery, you can play an important role. Because skin is not sterile, your skin needs to be as free of germs as possible. You can reduce the number of germs on your skin by washing with CHG (chlorahexidine gluconate) Soap before surgery.  CHG is an antiseptic cleaner which kills germs and bonds with the skin to continue killing germs even after washing.    Oral Hygiene is also  important to reduce your risk of infection.  Remember - BRUSH YOUR TEETH THE MORNING OF SURGERY WITH YOUR REGULAR TOOTHPASTE  Please do not use if you have an allergy to CHG or antibacterial soaps. If your skin becomes reddened/irritated stop using the CHG.  Do not shave (including legs and underarms) for at least 48 hours prior to first CHG shower. It is OK to shave your face.  Please follow these instructions carefully.   1. Shower the NIGHT BEFORE SURGERY and the MORNING OF SURGERY with CHG.   2. If you chose to wash your hair, wash your hair first as usual with your normal shampoo.  3. After you shampoo, rinse your hair and body thoroughly to remove the shampoo.  4. Use CHG as you would any other liquid soap. You can apply CHG directly to the skin and wash gently with a scrungie or a clean washcloth.   5. Apply the CHG Soap to your body ONLY FROM THE NECK DOWN.  Do not use on open wounds or open sores. Avoid contact with your eyes, ears, mouth and genitals (private parts). Wash Face and genitals (private parts)  with your normal soap.  6. Wash thoroughly, paying special attention to the area where your surgery will be performed.  7. Thoroughly rinse your body with warm water from the neck down.  8. DO NOT shower/wash with your normal soap after using and rinsing off the CHG Soap.  9. Pat yourself dry with a CLEAN TOWEL.  10. Wear CLEAN PAJAMAS to bed the night before surgery, wear comfortable clothes the morning of surgery  11. Place CLEAN SHEETS on your bed the night of your first shower and DO NOT SLEEP WITH PETS.    Day of Surgery:  Do not apply any deodorants/lotions.  Please wear clean clothes to the hospital/surgery center.   Remember to brush your teeth WITH YOUR REGULAR TOOTHPASTE.   Please read over the following fact sheets that you were given.

## 2019-03-13 ENCOUNTER — Encounter (HOSPITAL_COMMUNITY)
Admission: RE | Admit: 2019-03-13 | Discharge: 2019-03-13 | Disposition: A | Discharge status: Home / Self Care | Payer: BLUE CROSS/BLUE SHIELD | Source: Ambulatory Visit | Attending: Orthopaedic Surgery | Admitting: Orthopaedic Surgery

## 2019-03-13 ENCOUNTER — Encounter (HOSPITAL_COMMUNITY): Payer: Self-pay | Admitting: Physician Assistant

## 2019-03-13 ENCOUNTER — Encounter (HOSPITAL_COMMUNITY): Payer: Self-pay

## 2019-03-13 ENCOUNTER — Other Ambulatory Visit: Payer: Self-pay

## 2019-03-13 DIAGNOSIS — Z01812 Encounter for preprocedural laboratory examination: Secondary | ICD-10-CM | POA: Insufficient documentation

## 2019-03-13 LAB — BASIC METABOLIC PANEL
ANION GAP: 8 (ref 5–15)
BUN: 10 mg/dL (ref 6–20)
CALCIUM: 10.2 mg/dL (ref 8.9–10.3)
CO2: 28 mmol/L (ref 22–32)
Chloride: 104 mmol/L (ref 98–111)
Creatinine, Ser: 0.97 mg/dL (ref 0.61–1.24)
GLUCOSE: 104 mg/dL — AB (ref 70–99)
Potassium: 4.1 mmol/L (ref 3.5–5.1)
SODIUM: 140 mmol/L (ref 135–145)

## 2019-03-13 LAB — CBC
HEMATOCRIT: 47.9 % (ref 39.0–52.0)
HEMOGLOBIN: 15.6 g/dL (ref 13.0–17.0)
MCH: 29.5 pg (ref 26.0–34.0)
MCHC: 32.6 g/dL (ref 30.0–36.0)
MCV: 90.5 fL (ref 80.0–100.0)
NRBC: 0 % (ref 0.0–0.2)
Platelets: 233 10*3/uL (ref 150–400)
RBC: 5.29 MIL/uL (ref 4.22–5.81)
RDW: 11.9 % (ref 11.5–15.5)
WBC: 5.1 10*3/uL (ref 4.0–10.5)

## 2019-03-13 NOTE — Pre-Procedure Instructions (Signed)
Rehaan Pana Dirosa  03/13/2019      [facility] DRUG STORE #[id number] [name], [place] - [address] AT [facility] OF [facility] & [address] [place] [address] Phone: [phone] Fax: [phone]  [facility] DRUG STORE #[id number] [name], [place] - [address] AT [facility] OF [address] & [place] [address] [place] [address] Phone: [phone] Fax: [phone]    Your procedure is scheduled on Tuesday, March 31st.   Report to [facility] Entrance "A" Admitting at 5:30 A.M.             (posted surgery time 7:30a - 9a)   Call this number if you have problems the morning of surgery:  [phone]   Remember:  Do not eat any foods or drink any liquids after midnight, Monday.    Take these medicines the morning of surgery with A SIP OF WATER: NONE  7 days prior to surgery STOP taking any Aspirin (unless otherwise instructed by your surgeon), Aleve, Naproxen, Ibuprofen, Motrin, Advil, Goody's, BC's, all herbal medications, fish oil, and all vitamins.    Do not wear jewelry.  Do not wear lotions, powders, or colognes, or deodorant.  Men may shave face and neck.  Do not bring valuables to the hospital.  [facility] is not responsible for any belongings or valuables.  Contacts, dentures or bridgework may not be worn into surgery.  Leave your suitcase in the car.  After surgery it may be brought to your room.  For patients admitted to the hospital, discharge time will be determined by your treatment team.  Patients discharged the day of surgery will not be allowed to drive home, and will need someone to stay with you for the first 24 hrs.    Special instructions:   [place]- Preparing For Surgery  Before surgery, you can play an important role. Because skin is not sterile, your skin needs to be as free of germs as possible. You can reduce the number of germs on your skin by washing with CHG (chlorahexidine gluconate) Soap before surgery.  CHG  is an antiseptic cleaner which kills germs and bonds with the skin to continue killing germs even after washing.    Oral Hygiene is also important to reduce your risk of infection.    Remember - BRUSH YOUR TEETH THE MORNING OF SURGERY WITH YOUR REGULAR TOOTHPASTE  Please do not use if you have an allergy to CHG or antibacterial soaps. If your skin becomes reddened/irritated stop using the CHG.  Do not shave (including legs and underarms) for at least 48 hours prior to first CHG shower. It is OK to shave your face.  Please follow these instructions carefully.   1. Shower the NIGHT BEFORE SURGERY and the MORNING OF SURGERY with CHG.   2. If you chose to wash your hair, wash your hair first as usual with your normal shampoo.  3. After you shampoo, rinse your hair and body thoroughly to remove the shampoo.  4. Use CHG as you would any other liquid soap. You can apply CHG directly to the skin and wash gently with a scrungie or a clean washcloth.   5. Apply the CHG Soap to your body ONLY FROM THE NECK DOWN.  Do not use on open wounds or open sores. Avoid contact with your eyes, ears, mouth and genitals (private parts). Wash Face and genitals (private parts)  with your normal soap.  6. Wash thoroughly, paying  special attention to the area where your surgery will be performed.  7. Thoroughly rinse your body with warm water from the neck down.  8. DO NOT shower/wash with your normal soap after using and rinsing off the CHG Soap.  9. Pat yourself dry with a CLEAN TOWEL.  10. Wear CLEAN PAJAMAS to bed the night before surgery, wear comfortable clothes the morning of surgery  11. Place CLEAN SHEETS on your bed the night of your first shower and DO NOT SLEEP WITH PETS.  Day of Surgery:  Do not apply any deodorants/lotions.  Please wear clean clothes to the hospital/surgery center.   Remember to brush your teeth WITH YOUR REGULAR TOOTHPASTE.   Please read over the following fact sheets that  you were given.

## 2019-04-11 ENCOUNTER — Encounter (HOSPITAL_COMMUNITY): Admission: RE | Payer: Self-pay | Source: Home / Self Care

## 2019-04-11 ENCOUNTER — Ambulatory Visit (HOSPITAL_COMMUNITY)
Admission: RE | Admit: 2019-04-11 | Payer: BLUE CROSS/BLUE SHIELD | Source: Home / Self Care | Admitting: Orthopaedic Surgery

## 2019-04-11 SURGERY — REMOVAL, HARDWARE
Anesthesia: General | Laterality: Right

## 2019-04-25 ENCOUNTER — Other Ambulatory Visit: Payer: Self-pay | Admitting: Orthopaedic Surgery

## 2019-04-27 ENCOUNTER — Other Ambulatory Visit: Payer: Self-pay

## 2019-04-27 ENCOUNTER — Other Ambulatory Visit: Payer: Self-pay | Admitting: Orthopaedic Surgery

## 2019-04-27 ENCOUNTER — Encounter (HOSPITAL_BASED_OUTPATIENT_CLINIC_OR_DEPARTMENT_OTHER): Payer: Self-pay | Admitting: *Deleted

## 2019-05-01 ENCOUNTER — Other Ambulatory Visit (HOSPITAL_COMMUNITY)
Admission: RE | Admit: 2019-05-01 | Discharge: 2019-05-01 | Disposition: A | Discharge status: Home / Self Care | Payer: BLUE CROSS/BLUE SHIELD | Source: Ambulatory Visit | Attending: Orthopaedic Surgery | Admitting: Orthopaedic Surgery

## 2019-05-01 ENCOUNTER — Other Ambulatory Visit: Payer: Self-pay

## 2019-05-01 DIAGNOSIS — Z1159 Encounter for screening for other viral diseases: Secondary | ICD-10-CM | POA: Diagnosis not present

## 2019-05-01 LAB — SARS CORONAVIRUS 2 BY RT PCR (HOSPITAL ORDER, PERFORMED IN ~~LOC~~ HOSPITAL LAB): SARS Coronavirus 2: NEGATIVE

## 2019-05-01 NOTE — Progress Notes (Signed)
Ensure pre surgery drink given with instructions to complete by 0630 dos, pt verbalized understanding. 

## 2019-05-02 ENCOUNTER — Other Ambulatory Visit: Payer: Self-pay

## 2019-05-02 ENCOUNTER — Ambulatory Visit (HOSPITAL_BASED_OUTPATIENT_CLINIC_OR_DEPARTMENT_OTHER): Payer: BLUE CROSS/BLUE SHIELD | Admitting: Anesthesiology

## 2019-05-02 ENCOUNTER — Encounter (HOSPITAL_BASED_OUTPATIENT_CLINIC_OR_DEPARTMENT_OTHER)
Admission: RE | Disposition: A | Discharge status: Home / Self Care | Payer: Self-pay | Source: Home / Self Care | Attending: Orthopaedic Surgery

## 2019-05-02 ENCOUNTER — Ambulatory Visit (HOSPITAL_COMMUNITY)
Admission: RE | Admit: 2019-05-02 | Discharge: 2019-05-02 | Disposition: A | Discharge status: Home / Self Care | Payer: BLUE CROSS/BLUE SHIELD | Attending: Orthopaedic Surgery | Admitting: Orthopaedic Surgery

## 2019-05-02 ENCOUNTER — Encounter (HOSPITAL_BASED_OUTPATIENT_CLINIC_OR_DEPARTMENT_OTHER): Payer: Self-pay | Admitting: *Deleted

## 2019-05-02 DIAGNOSIS — F17210 Nicotine dependence, cigarettes, uncomplicated: Secondary | ICD-10-CM | POA: Diagnosis not present

## 2019-05-02 DIAGNOSIS — Z4589 Encounter for adjustment and management of other implanted devices: Secondary | ICD-10-CM | POA: Insufficient documentation

## 2019-05-02 HISTORY — PX: HARDWARE REMOVAL: SHX979

## 2019-05-02 SURGERY — REMOVAL, HARDWARE
Anesthesia: General | Laterality: Right

## 2019-05-02 MED ORDER — FENTANYL CITRATE (PF) 100 MCG/2ML IJ SOLN
INTRAMUSCULAR | Status: AC
  Filled 2019-05-02: qty 2

## 2019-05-02 MED ORDER — LIDOCAINE HCL (CARDIAC) PF 100 MG/5ML IV SOSY
PREFILLED_SYRINGE | INTRAVENOUS | Status: DC | PRN
  Administered 2019-05-02: 60 mg via INTRAVENOUS

## 2019-05-02 MED ORDER — ONDANSETRON HCL 4 MG/2ML IJ SOLN
INTRAMUSCULAR | Status: AC
  Filled 2019-05-02: qty 16

## 2019-05-02 MED ORDER — PROPOFOL 10 MG/ML IV BOLUS
INTRAVENOUS | Status: AC
  Filled 2019-05-02: qty 20

## 2019-05-02 MED ORDER — OXYCODONE HCL 5 MG PO TABS
5.0000 mg | ORAL_TABLET | Freq: Once | ORAL | Status: AC | PRN
  Administered 2019-05-02: 5 mg via ORAL

## 2019-05-02 MED ORDER — EPHEDRINE 5 MG/ML INJ
INTRAVENOUS | Status: AC
  Filled 2019-05-02: qty 20

## 2019-05-02 MED ORDER — OXYCODONE HCL 5 MG PO TABS
ORAL_TABLET | ORAL | Status: AC
  Filled 2019-05-02: qty 1

## 2019-05-02 MED ORDER — PROPOFOL 10 MG/ML IV BOLUS
INTRAVENOUS | Status: DC | PRN
  Administered 2019-05-02: 200 mg via INTRAVENOUS

## 2019-05-02 MED ORDER — LIDOCAINE 2% (20 MG/ML) 5 ML SYRINGE
INTRAMUSCULAR | Status: AC
  Filled 2019-05-02: qty 20

## 2019-05-02 MED ORDER — LACTATED RINGERS IV SOLN
INTRAVENOUS | Status: DC
  Administered 2019-05-02 (×2): via INTRAVENOUS

## 2019-05-02 MED ORDER — FENTANYL CITRATE (PF) 100 MCG/2ML IJ SOLN: 25.0000 ug | INTRAMUSCULAR | Status: DC | PRN

## 2019-05-02 MED ORDER — DEXAMETHASONE SODIUM PHOSPHATE 10 MG/ML IJ SOLN
INTRAMUSCULAR | Status: DC | PRN
  Administered 2019-05-02: 10 mg via INTRAVENOUS

## 2019-05-02 MED ORDER — OXYCODONE HCL 5 MG/5ML PO SOLN: 5.0000 mg | Freq: Once | ORAL | Status: AC | PRN

## 2019-05-02 MED ORDER — PROMETHAZINE HCL 25 MG/ML IJ SOLN: 6.2500 mg | INTRAMUSCULAR | Status: DC | PRN

## 2019-05-02 MED ORDER — PROPOFOL 500 MG/50ML IV EMUL
INTRAVENOUS | Status: DC | PRN
  Administered 2019-05-02: 25 ug/kg/min via INTRAVENOUS

## 2019-05-02 MED ORDER — PHENYLEPHRINE 40 MCG/ML (10ML) SYRINGE FOR IV PUSH (FOR BLOOD PRESSURE SUPPORT)
PREFILLED_SYRINGE | INTRAVENOUS | Status: AC
  Filled 2019-05-02: qty 20

## 2019-05-02 MED ORDER — PROPOFOL 500 MG/50ML IV EMUL
INTRAVENOUS | Status: AC
  Filled 2019-05-02: qty 100

## 2019-05-02 MED ORDER — SCOPOLAMINE 1 MG/3DAYS TD PT72: 1.0000 | MEDICATED_PATCH | Freq: Once | TRANSDERMAL | Status: DC | PRN

## 2019-05-02 MED ORDER — DEXAMETHASONE SODIUM PHOSPHATE 10 MG/ML IJ SOLN
INTRAMUSCULAR | Status: AC
  Filled 2019-05-02: qty 4

## 2019-05-02 MED ORDER — ONDANSETRON HCL 4 MG/2ML IJ SOLN
INTRAMUSCULAR | Status: DC | PRN
  Administered 2019-05-02: 4 mg via INTRAVENOUS

## 2019-05-02 MED ORDER — MIDAZOLAM HCL 2 MG/2ML IJ SOLN: 1.0000 mg | INTRAMUSCULAR | Status: DC | PRN

## 2019-05-02 MED ORDER — OXYCODONE HCL 5 MG PO TABS: 5.0000 mg | ORAL_TABLET | Freq: Four times a day (QID) | ORAL | 0 refills | Status: AC | PRN

## 2019-05-02 MED ORDER — 0.9 % SODIUM CHLORIDE (POUR BTL) OPTIME
TOPICAL | Status: DC | PRN
  Administered 2019-05-02: 11:00:00 1000 mL

## 2019-05-02 MED ORDER — BUPIVACAINE HCL 0.5 % IJ SOLN
INTRAMUSCULAR | Status: DC | PRN
  Administered 2019-05-02: 10 mL

## 2019-05-02 MED ORDER — CHLORHEXIDINE GLUCONATE 4 % EX LIQD: 60.0000 mL | Freq: Once | CUTANEOUS | Status: DC

## 2019-05-02 MED ORDER — MIDAZOLAM HCL 2 MG/2ML IJ SOLN
INTRAMUSCULAR | Status: AC
  Filled 2019-05-02: qty 2

## 2019-05-02 MED ORDER — MIDAZOLAM HCL 5 MG/5ML IJ SOLN
INTRAMUSCULAR | Status: DC | PRN
  Administered 2019-05-02: 2 mg via INTRAVENOUS

## 2019-05-02 MED ORDER — FENTANYL CITRATE (PF) 100 MCG/2ML IJ SOLN
50.0000 ug | INTRAMUSCULAR | Status: AC | PRN
  Administered 2019-05-02: 100 ug via INTRAVENOUS
  Administered 2019-05-02 (×2): 50 ug via INTRAVENOUS

## 2019-05-02 MED ORDER — CEFAZOLIN SODIUM-DEXTROSE 2-4 GM/100ML-% IV SOLN
INTRAVENOUS | Status: AC
  Filled 2019-05-02: qty 100

## 2019-05-02 SURGICAL SUPPLY — 56 items
BANDAGE ACE 4X5 VEL STRL LF (GAUZE/BANDAGES/DRESSINGS) ×3 IMPLANT
BANDAGE ACE 6X5 VEL STRL LF (GAUZE/BANDAGES/DRESSINGS) IMPLANT
BANDAGE ESMARK 6X9 LF (GAUZE/BANDAGES/DRESSINGS) IMPLANT
BENZOIN TINCTURE PRP APPL 2/3 (GAUZE/BANDAGES/DRESSINGS) IMPLANT
BLADE SURG 15 STRL LF DISP TIS (BLADE) ×1 IMPLANT
BLADE SURG 15 STRL SS (BLADE) ×2
BNDG ESMARK 4X9 LF (GAUZE/BANDAGES/DRESSINGS) ×3 IMPLANT
BNDG ESMARK 6X9 LF (GAUZE/BANDAGES/DRESSINGS)
CHLORAPREP W/TINT 26 (MISCELLANEOUS) ×3 IMPLANT
CLOSURE WOUND 1/2 X4 (GAUZE/BANDAGES/DRESSINGS)
COVER BACK TABLE REUSABLE LG (DRAPES) ×3 IMPLANT
COVER WAND RF STERILE (DRAPES) IMPLANT
CUFF TOURN SGL QUICK 34 (TOURNIQUET CUFF)
CUFF TRNQT CYL 34X4.125X (TOURNIQUET CUFF) IMPLANT
DECANTER SPIKE VIAL GLASS SM (MISCELLANEOUS) IMPLANT
DRAPE EXTREMITY T 121X128X90 (DISPOSABLE) ×3 IMPLANT
DRAPE IMP U-DRAPE 54X76 (DRAPES) ×3 IMPLANT
DRAPE OEC MINIVIEW 54X84 (DRAPES) ×3 IMPLANT
DRAPE U-SHAPE 47X51 STRL (DRAPES) ×3 IMPLANT
ELECT REM PT RETURN 9FT ADLT (ELECTROSURGICAL) ×3
ELECTRODE REM PT RTRN 9FT ADLT (ELECTROSURGICAL) ×1 IMPLANT
GAUZE SPONGE 4X4 12PLY STRL (GAUZE/BANDAGES/DRESSINGS) ×3 IMPLANT
GAUZE XEROFORM 1X8 LF (GAUZE/BANDAGES/DRESSINGS) ×3 IMPLANT
GLOVE BIO SURGEON STRL SZ7.5 (GLOVE) ×3 IMPLANT
GLOVE BIOGEL PI IND STRL 8 (GLOVE) ×1 IMPLANT
GLOVE BIOGEL PI INDICATOR 8 (GLOVE) ×2
GOWN STRL REUS W/ TWL LRG LVL3 (GOWN DISPOSABLE) ×1 IMPLANT
GOWN STRL REUS W/ TWL XL LVL3 (GOWN DISPOSABLE) ×1 IMPLANT
GOWN STRL REUS W/TWL LRG LVL3 (GOWN DISPOSABLE) ×2
GOWN STRL REUS W/TWL XL LVL3 (GOWN DISPOSABLE) ×2
NEEDLE HYPO 25X1 1.5 SAFETY (NEEDLE) IMPLANT
NS IRRIG 1000ML POUR BTL (IV SOLUTION) ×3 IMPLANT
PACK BASIN DAY SURGERY FS (CUSTOM PROCEDURE TRAY) ×3 IMPLANT
PAD CAST 4YDX4 CTTN HI CHSV (CAST SUPPLIES) ×1 IMPLANT
PADDING CAST COTTON 4X4 STRL (CAST SUPPLIES) ×2
PADDING CAST SYNTHETIC 4 (CAST SUPPLIES)
PADDING CAST SYNTHETIC 4X4 STR (CAST SUPPLIES) IMPLANT
PENCIL BUTTON HOLSTER BLD 10FT (ELECTRODE) ×3 IMPLANT
SLEEVE SCD COMPRESS KNEE MED (MISCELLANEOUS) ×3 IMPLANT
SPLINT FIBERGLASS 4X30 (CAST SUPPLIES) IMPLANT
SPONGE LAP 18X18 RF (DISPOSABLE) IMPLANT
STOCKINETTE 6  STRL (DRAPES) ×2
STOCKINETTE 6 STRL (DRAPES) ×1 IMPLANT
STRIP CLOSURE SKIN 1/2X4 (GAUZE/BANDAGES/DRESSINGS) IMPLANT
SUCTION FRAZIER HANDLE 10FR (MISCELLANEOUS) ×2
SUCTION TUBE FRAZIER 10FR DISP (MISCELLANEOUS) ×1 IMPLANT
SUT ETHILON 3 0 PS 1 (SUTURE) ×3 IMPLANT
SUT MNCRL AB 3-0 PS2 18 (SUTURE) ×3 IMPLANT
SUT PDS AB 2-0 CT2 27 (SUTURE) IMPLANT
SUT VIC AB 3-0 FS2 27 (SUTURE) IMPLANT
SYR BULB 3OZ (MISCELLANEOUS) ×3 IMPLANT
SYR CONTROL 10ML LL (SYRINGE) ×3 IMPLANT
TOWEL GREEN STERILE FF (TOWEL DISPOSABLE) ×6 IMPLANT
TUBE CONNECTING 20'X1/4 (TUBING) ×1
TUBE CONNECTING 20X1/4 (TUBING) ×2 IMPLANT
UNDERPAD 30X30 (UNDERPADS AND DIAPERS) ×3 IMPLANT

## 2019-05-02 NOTE — H&P (Signed)
Peter Holt is an 23 y.o. male.   Chief Complaint: Status post open treatment of Lisfranc fracture dislocation on 09/27/2018 with retained orthopedic hardware HPI: Peter Holt is here today for hardware removal.  His surgery is a bit delayed due to the coronavirus and surgical restrictions.  Patient states he has been doing well and his foot has gotten better.  He has occasional discomfort and swelling but overall is wearing normal shoes, doing his regular job and is pleased with his progress.  He does note some stiffness about the ankle and was inquiring about physical therapy in the future.  Overall he denies any recent fevers or chills or new joint or extremity pains.  History reviewed. No pertinent past medical history.  Past Surgical History:  Procedure Laterality Date  . OPEN REDUCTION INTERNAL FIXATION (ORIF) FOOT LISFRANC FRACTURE Right 09/27/2018   Procedure: OPEN REDUCTION INTERNAL FIXATION (ORIF) FOOT LISFRANC AND NAVICULAR FRACTURES;  Surgeon: Terance HartAdair, Ariq Khamis R, MD;  Location: [facility];  Service: Orthopedics;  Laterality: Right;  . Pilondial cyst    . WISDOM TOOTH EXTRACTION      History reviewed. No pertinent family history. Social History:  reports that he has been smoking cigarettes and e-cigarettes. He has never used smokeless tobacco. He reports current alcohol use. He reports that he does not use drugs.  Allergies:  Allergies  Allergen Reactions  . Penicillins Rash    Did it involve swelling of the face/tongue/throat, SOB, or low BP? Unknown Did it involve sudden or severe rash/hives, skin peeling, or any reaction on the inside of your mouth or nose? Yes Did you need to seek medical attention at a hospital or doctor's office? Yes When did it last happen?10-15 years ago If all above answers are "NO", may proceed with cephalosporin use.     No medications prior to admission.    Results for orders placed or performed during the hospital encounter  of 05/01/19 (from the past 48 hour(s))  SARS Coronavirus 2 (CEPHEID - Performed in [facility] hospital lab), Hosp Order     Status: None   Collection Time: 05/01/19  9:24 AM  Result Value Ref Range   SARS Coronavirus 2 NEGATIVE NEGATIVE    Comment: (NOTE) If result is NEGATIVE SARS-CoV-2 target nucleic acids are NOT DETECTED. The SARS-CoV-2 RNA is generally detectable in upper and lower  respiratory specimens during the acute phase of infection. The lowest  concentration of SARS-CoV-2 viral copies this assay can detect is 250  copies / mL. A negative result does not preclude SARS-CoV-2 infection  and should not be used as the sole basis for treatment or other  patient management decisions.  A negative result may occur with  improper specimen collection / handling, submission of specimen other  than nasopharyngeal swab, presence of viral mutation(s) within the  areas targeted by this assay, and inadequate number of viral copies  (<250 copies / mL). A negative result must be combined with clinical  observations, patient history, and epidemiological information. If result is POSITIVE SARS-CoV-2 target nucleic acids are DETECTED. The SARS-CoV-2 RNA is generally detectable in upper and lower  respiratory specimens dur ing the acute phase of infection.  Positive  results are indicative of active infection with SARS-CoV-2.  Clinical  correlation with patient history and other diagnostic information is  necessary to determine patient infection status.  Positive results do  not rule out bacterial infection or co-infection with other viruses. If result is PRESUMPTIVE POSTIVE SARS-CoV-2 nucleic acids  MAY BE PRESENT.   A presumptive positive result was obtained on the submitted specimen  and confirmed on repeat testing.  While 2019 novel coronavirus  (SARS-CoV-2) nucleic acids may be present in the submitted sample  additional confirmatory testing may be necessary for epidemiological  and / or  clinical management purposes  to differentiate between  SARS-CoV-2 and other Sarbecovirus currently known to infect humans.  If clinically indicated additional testing with an alternate test  methodology [phone]) is advised. The SARS-CoV-2 RNA is generally  detectable in upper and lower respiratory sp ecimens during the acute  phase of infection. The expected result is Negative. Fact Sheet for Patients:  BoilerBrush.com.cy Fact Sheet for Healthcare Providers: https://pope.com/ This test is not yet approved or cleared by the [place] FDA and has been authorized for detection and/or diagnosis of SARS-CoV-2 by FDA under an Emergency Use Authorization (EUA).  This EUA will remain in effect (meaning this test can be used) for the duration of the COVID-19 declaration under Section 564(b)(1) of the Act, 21 U.S.C. section 360bbb-3(b)(1), unless the authorization is terminated or revoked sooner. Performed at [facility], [address] [place], [place] [address]    No results found.  Review of Systems  Constitutional: Negative.   Eyes: Negative.   Respiratory: Negative.   Cardiovascular: Negative.   Gastrointestinal: Negative.   Musculoskeletal:       Right foot swelling  Skin: Negative.   Neurological: Negative.   Psychiatric/Behavioral: Negative.     Blood pressure 124/80, pulse 69, temperature 98.1 F (36.7 C), temperature source Oral, resp. rate 18, height 5\' 9"  (1.753 m), weight 91.6 kg, SpO2 99 %. Physical Exam  HENT:  Head: Normocephalic.  Eyes: Conjunctivae are normal.  Neck: Neck supple.  Cardiovascular: Normal rate.  Respiratory: Effort normal.  GI: He exhibits no distension.  Musculoskeletal:     Comments: Right foot demonstrates some residual swelling.  Surgical incisions are well-healed.  No areas of tenderness to palpation on the dorsal or dorsal medial foot.  Patient has some residual  decrease sensation about the dorsal incision but otherwise sensation intact on the lateral, medial foot and plantar foot.  Palpable dorsalis pedis pulse.  No toe deformities.  No tenderness about the anterior ankle.  Neurological: He is alert.  Skin: Skin is warm.  Psychiatric: He has a normal mood and affect.     Assessment/Plan We will proceed with planned hardware removal about his Lisfranc and intercuneiform joints.  We will assess stability at that time as well.  Patient understands the risk, benefits and alternatives to surgery which include but are not limited to wound healing complications, infection, need for further surgery, instability at the previously fixed joints as well as damage surrounding structures.  He also understands postoperative weightbearing restrictions which were discussed with him.  He will be weightbearing as tolerated in his walking boot until the skin heals in 2 weeks.  He wishes to proceed with surgery.  Terance Hart, MD 05/02/2019, 10:04 AM

## 2019-05-02 NOTE — Anesthesia Preprocedure Evaluation (Addendum)
Anesthesia Evaluation  Patient identified by MRN, date of birth, ID band Patient awake    Reviewed: Allergy & Precautions, NPO status , Patient's Chart, lab work & pertinent test results  History of Anesthesia Complications Negative for: history of anesthetic complications  Airway Mallampati: II  TM Distance: >3 FB Neck ROM: Full    Dental  (+) Dental Advisory Given   Pulmonary Current Smoker,    breath sounds clear to auscultation       Cardiovascular negative cardio ROS   Rhythm:Regular Rate:Normal     Neuro/Psych negative neurological ROS  negative psych ROS   GI/Hepatic negative GI ROS, Neg liver ROS,   Endo/Other  negative endocrine ROS  Renal/GU negative Renal ROS     Musculoskeletal negative musculoskeletal ROS (+)   Abdominal   Peds  Hematology negative hematology ROS (+)   Anesthesia Other Findings   Reproductive/Obstetrics                            Anesthesia Physical Anesthesia Plan  ASA: II  Anesthesia Plan: General   Post-op Pain Management:    Induction: Intravenous  PONV Risk Score and Plan: 2 and Treatment may vary due to age or medical condition, Ondansetron, Dexamethasone and Midazolam  Airway Management Planned: LMA  Additional Equipment: None  Intra-op Plan:   Post-operative Plan: Extubation in OR  Informed Consent: I have reviewed the patients History and Physical, chart, labs and discussed the procedure including the risks, benefits and alternatives for the proposed anesthesia with the patient or authorized representative who has indicated his/her understanding and acceptance.     Dental advisory given  Plan Discussed with: CRNA and Anesthesiologist  Anesthesia Plan Comments:        Anesthesia Quick Evaluation

## 2019-05-02 NOTE — Discharge Instructions (Signed)
DR. Susa SimmondsADAIR [facility] POST-OP INSTRUCTIONS   Pain Management 1. The numbing medicine and your leg will last around 4 hours, take a dose of your pain medicine as soon as you feel it wearing off to avoid rebound pain. 2. Keep your foot elevated above heart level.  Make sure that your heel hangs free ('floats'). 3. Take all prescribed medication as directed. 4. If taking narcotic pain medication you may want to use an over-the-counter stool softener to avoid constipation. 5. You may take over-the-counter NSAIDs (ibuprofen, naproxen, etc.) as well as over-the-counter acetaminophen as directed on the packaging as a supplement for your pain and may also use it to wean away from the prescription medication.  Activity ? Weightbearing as tolerated in a boot ?   First Postoperative Visit 1. Your first postop visit will be at least 2 weeks after surgery.  This should be scheduled when you schedule surgery. 2. If you do not have a postoperative visit scheduled please call [phone] to schedule an appointment. 3. At the appointment your incision will be evaluated for suture removal, x-rays will be obtained if necessary.  General Instructions 1. Swelling is very common after foot and ankle surgery.  It often takes 3 months for the foot and ankle to begin to feel comfortable.  Some amount of swelling will persist for 6-12 months. 2. DO NOT change the dressing.  If there is a problem with the dressing (too tight, loose, gets wet, etc.) please contact Dr. Donnie MesaAdair's office. 3. DO NOT get the dressing wet.  For showers you can use an over-the-counter cast cover or wrap a washcloth around the top of your dressing and then cover it with a plastic bag and tape it to your leg. 4. DO NOT soak the incision (no tubs, pools, bath, etc.) until you have approval from Dr. Susa SimmondsAdair.  Contact Dr. Garret ReddishAdairs office or go to Emergency Room if: 1. Temperature above 101 F. 2. Increasing pain that is unresponsive to pain  medication or elevation 3. Excessive redness or swelling in your foot 4. Dressing problems - excessive bloody drainage, looseness or tightness, or if dressing gets wet 5. Develop pain, swelling, warmth, or discoloration of your calf       Post Anesthesia Home Care Instructions  Activity: Get plenty of rest for the remainder of the day. A responsible individual must stay with you for 24 hours following the procedure.  For the next 24 hours, DO NOT: -Drive a car -Advertising copywriterperate machinery -Drink alcoholic beverages -Take any medication unless instructed by your physician -Make any legal decisions or sign important papers.  Meals: Start with liquid foods such as gelatin or soup. Progress to regular foods as tolerated. Avoid greasy, spicy, heavy foods. If nausea and/or vomiting occur, drink only clear liquids until the nausea and/or vomiting subsides. Call your physician if vomiting continues.  Special Instructions/Symptoms: Your throat may feel dry or sore from the anesthesia or the breathing tube placed in your throat during surgery. If this causes discomfort, gargle with warm salt water. The discomfort should disappear within 24 hours.  If you had a scopolamine patch placed behind your ear for the management of post- operative nausea and/or vomiting:  1. The medication in the patch is effective for 72 hours, after which it should be removed.  Wrap patch in a tissue and discard in the trash. Wash hands thoroughly with soap and water. 2. You may remove the patch earlier than 72 hours if you experience unpleasant side  effects which may include dry mouth, dizziness or visual disturbances. 3. Avoid touching the patch. Wash your hands with soap and water after contact with the patch.    Post Anesthesia Home Care Instructions  Activity: Get plenty of rest for the remainder of the day. A responsible individual must stay with you for 24 hours following the procedure.  For the next 24 hours, DO  NOT: -Drive a car -Advertising copywriter -Drink alcoholic beverages -Take any medication unless instructed by your physician -Make any legal decisions or sign important papers.  Meals: Start with liquid foods such as gelatin or soup. Progress to regular foods as tolerated. Avoid greasy, spicy, heavy foods. If nausea and/or vomiting occur, drink only clear liquids until the nausea and/or vomiting subsides. Call your physician if vomiting continues.  Special Instructions/Symptoms: Your throat may feel dry or sore from the anesthesia or the breathing tube placed in your throat during surgery. If this causes discomfort, gargle with warm salt water. The discomfort should disappear within 24 hours.  If you had a scopolamine patch placed behind your ear for the management of post- operative nausea and/or vomiting:  1. The medication in the patch is effective for 72 hours, after which it should be removed.  Wrap patch in a tissue and discard in the trash. Wash hands thoroughly with soap and water. 2. You may remove the patch earlier than 72 hours if you experience unpleasant side effects which may include dry mouth, dizziness or visual disturbances. 3. Avoid touching the patch. Wash your hands with soap and water after contact with the patch.

## 2019-05-02 NOTE — Anesthesia Procedure Notes (Signed)
Procedure Name: LMA Insertion Date/Time: 05/02/2019 10:26 AM Performed by: Sheryn Bison, CRNA Pre-anesthesia Checklist: Patient identified, Emergency Drugs available, Suction available and Patient being monitored Patient Re-evaluated:Patient Re-evaluated prior to induction Oxygen Delivery Method: Circle system utilized Preoxygenation: Pre-oxygenation with 100% oxygen Induction Type: IV induction Ventilation: Mask ventilation without difficulty LMA: LMA inserted LMA Size: 4.0 Number of attempts: 1 Airway Equipment and Method: Bite block Placement Confirmation: positive ETCO2 Tube secured with: Tape Dental Injury: Teeth and Oropharynx as per pre-operative assessment

## 2019-05-02 NOTE — Op Note (Signed)
  Peter Holt male 23 y.o. 05/02/2019  PreOperative Diagnosis: Retained orthopedic hardware right foot  PostOperative Diagnosis: Same  PROCEDURE: Hardware removal deep, right foot  SURGEON: Dub Mikes, MD  ASSISTANT: None  ANESTHESIA: General LMA with half percent Marcaine infiltration locally  FINDINGS: Retained orthopedic hardware Stable Lisfranc and intercuneiform joints  IMPLANTS: None  INDICATIONS:22 y.o. male sustained a medial column and Lisfranc injury in October 2019.  He underwent surgery and this is his planned hardware removal.  We discussed the risk, benefits and alternatives of surgery which included but were not limited to wound healing complications, infection, continued instability, continued pain, swelling, need for further surgery demonstrating structures.  Patient was to proceed with surgery despite these risks.  PROCEDURE: Patient was identified in the preoperative holding area.  The right leg was marked on the cell.  Consent was signed myself and the patient.  He was taken the operative suite placed supine the operative table.  General LMA anesthesia was induced that difficulty.  Bone foam was used.  The right lower extremity was prepped and draped in usual sterile fashion.  Preoperative antibiotics were given.  Surgical timeout was performed.  A 4 inch Esmarch tourniquet was placed about the ankle.  We made a longitudinal incision overlying the previous insertion site for the intercuneiform and Lisfranc screws on the medial foot.  This was taken sharply down through skin and subcutaneous tissue.  The tibialis anterior tendon was identified mobilized and retracted.  The screw position was confirmed on fluoroscopy.  Then using blunt dissection we were able to identify all 3 of the screw heads without difficulty.  They were removed without difficulty.  We then confirmed complete removal on fluoroscopy.  The medial column was manipulated manually and not  found to have any gross instability obvious on fluoroscopy.  The wound was then irrigated and the tourniquet was released.  Hemostasis was obtained.  The deep tissue was closed with a 3-0 Monocryl as well as the subcuticular tissue.  Skin was closed with 3-0 nylon.  Counts were correct at the end the case.  There were no complications.  Patient was awakened from anesthesia and taken recovery in stable condition.  POST OPERATIVE INSTRUCTIONS: Weightbearing as tolerated in a walking boot until able to wean Keep dressing in place until follow-up Follow-up in 2 weeks for suture removal and weightbearing x-rays Call office with concerns No need for DVT prophylaxis in this ambulatory patient  TOURNIQUET TIME: 22 minutes  BLOOD LOSS:  Minimal         DRAINS: none         SPECIMEN: none       COMPLICATIONS:  * No complications entered in OR log *         Disposition: PACU - hemodynamically stable.         Condition: stable

## 2019-05-02 NOTE — Transfer of Care (Signed)
Immediate Anesthesia Transfer of Care Note  Patient: Peter Holt  Procedure(s) Performed: HARDWARE REMOVAL RIGHT FOOT (Right )  Patient Location: PACU  Anesthesia Type:General  Level of Consciousness: drowsy and patient cooperative  Airway & Oxygen Therapy: Patient Spontanous Breathing and Patient connected to nasal cannula oxygen  Post-op Assessment: Report given to RN and Post -op Vital signs reviewed and stable  Post vital signs: Reviewed and stable  Last Vitals:  Vitals Value Taken Time  BP    Temp    Pulse 66 05/02/2019 11:09 AM  Resp 15 05/02/2019 11:09 AM  SpO2 98 % 05/02/2019 11:09 AM  Vitals shown include unvalidated device data.  Last Pain:  Vitals:   05/02/19 0912  TempSrc: Oral  PainSc: 1       Patients Stated Pain Goal: 1 (05/02/19 0912)  Complications: No apparent anesthesia complications

## 2019-05-02 NOTE — Anesthesia Postprocedure Evaluation (Signed)
Anesthesia Post Note  Patient: Peter Holt  Procedure(s) Performed: HARDWARE REMOVAL RIGHT FOOT (Right )     Patient location during evaluation: PACU Anesthesia Type: General Level of consciousness: awake and alert Pain management: pain level controlled Vital Signs Assessment: post-procedure vital signs reviewed and stable Respiratory status: spontaneous breathing, nonlabored ventilation and respiratory function stable Cardiovascular status: blood pressure returned to baseline and stable Postop Assessment: no apparent nausea or vomiting Anesthetic complications: no    Last Vitals:  Vitals:   05/02/19 1130 05/02/19 1202  BP: 126/82 (!) 132/93  Pulse: 73 68  Resp: 15 16  Temp:  36.7 C  SpO2: 100% 100%    Last Pain:  Vitals:   05/02/19 1202  TempSrc: Oral  PainSc: 2                  Beryle Lathe

## 2019-05-03 ENCOUNTER — Encounter (HOSPITAL_BASED_OUTPATIENT_CLINIC_OR_DEPARTMENT_OTHER): Payer: Self-pay | Admitting: Orthopaedic Surgery

## 2019-10-19 IMAGING — CT CT 3D INDEPENDENT WKST
3 of 4 series · 13 of 20 positions shown, 14 images · non-contrast
Comparison: Plain films of the right foot and ankle 09/18/2018.

CLINICAL DATA: The suffered right foot fractures playing baseball
09/18/2018. Pain. Initial encounter.

EXAM:
CT OF THE RIGHT FOOT WITHOUT CONTRAST
TECHNIQUE: Multidetector CT imaging of the right foot was performed according
to the standard protocol. Multiplanar CT image reconstructions were
also generated.

[Series 4: soft tissue lower extremity · axial · 0.66mm/px · z∈[-236,-62]mm · 8 of 113 slices shown]
[im 13/113  soft-tissue]
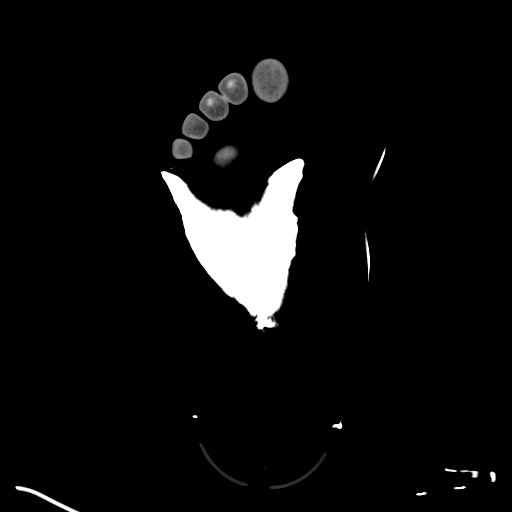
[im 25/113  soft-tissue]
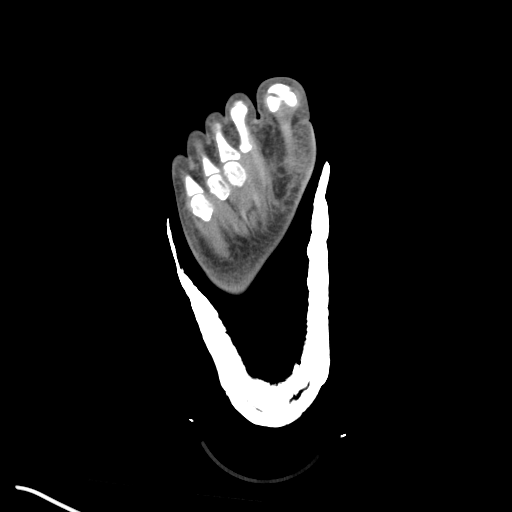
[im 38/113  soft-tissue]
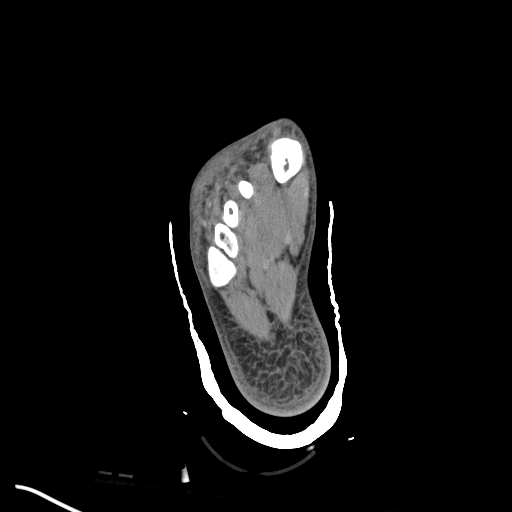
[im 50/113  soft-tissue]
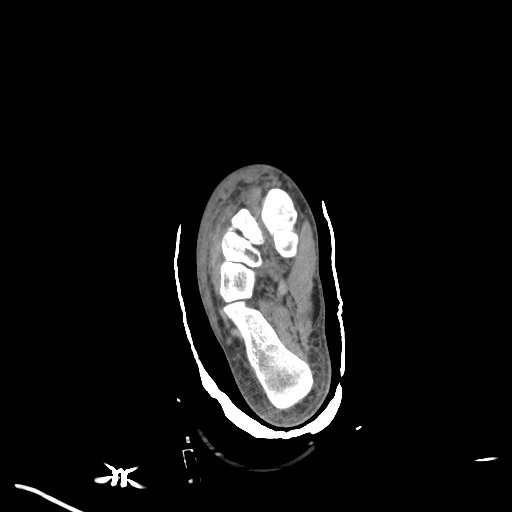
[im 63/113  soft-tissue]
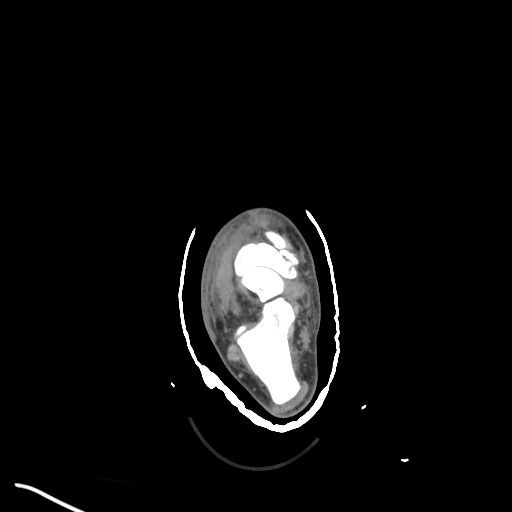
[im 75/113  soft-tissue]
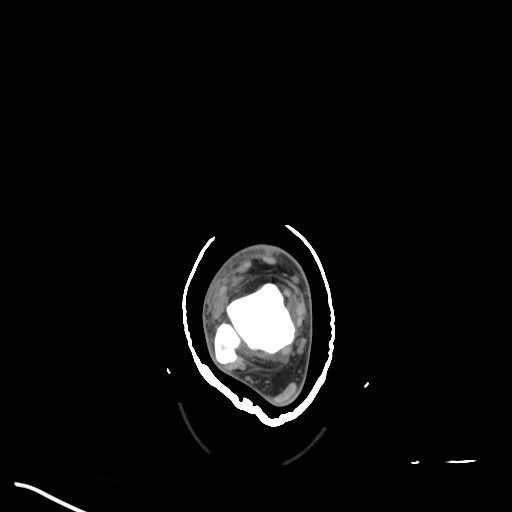
[im 88/113  soft-tissue]
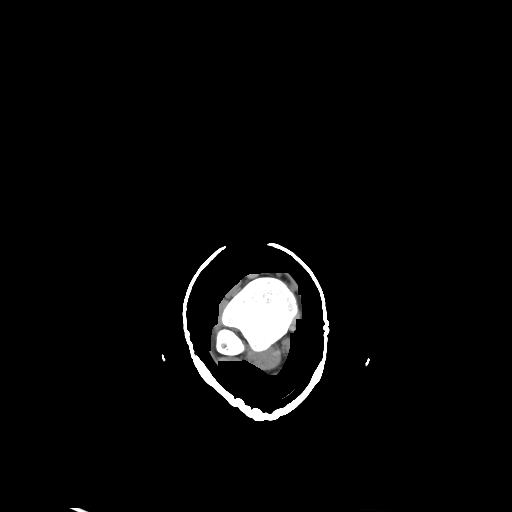
[im 100/113  soft-tissue]
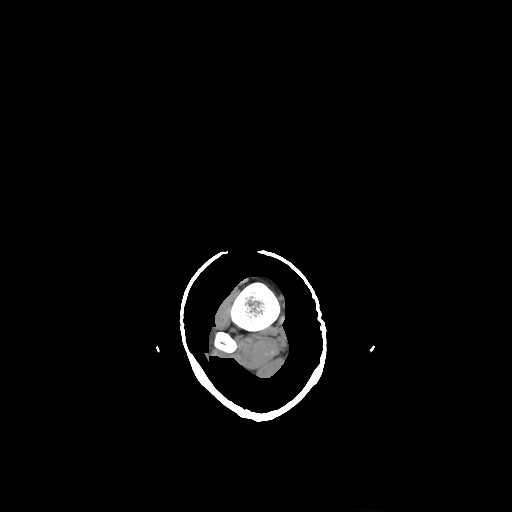

[Series 603: cor bone · coronal · 0.66mm/px · 3 of 134 slices shown]
[im 10/134  bone]
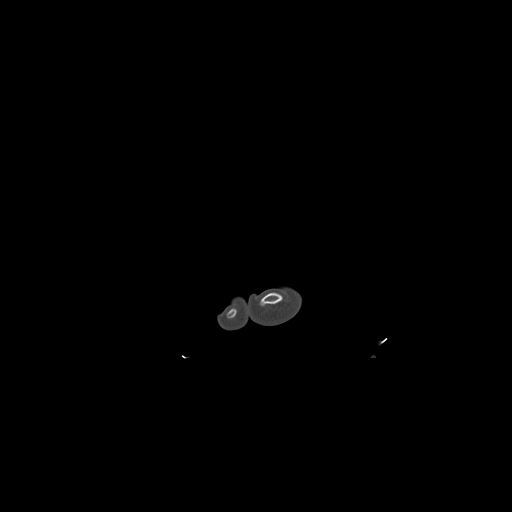
[im 33/134  bone]
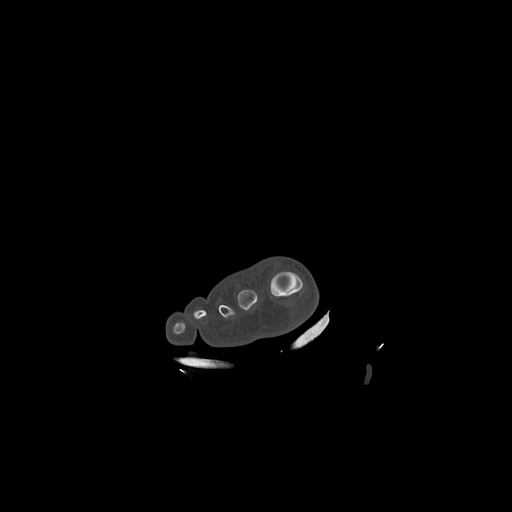
[im 56/134  bone]
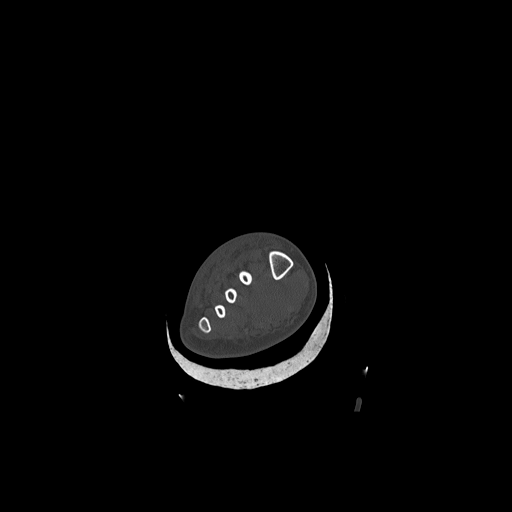

[Series 606: long axis bone · axial · 0.66mm/px · z∈[-264,-241]mm · 2 of 44 slices shown, 3 images]
[im 15/44  soft-tissue]
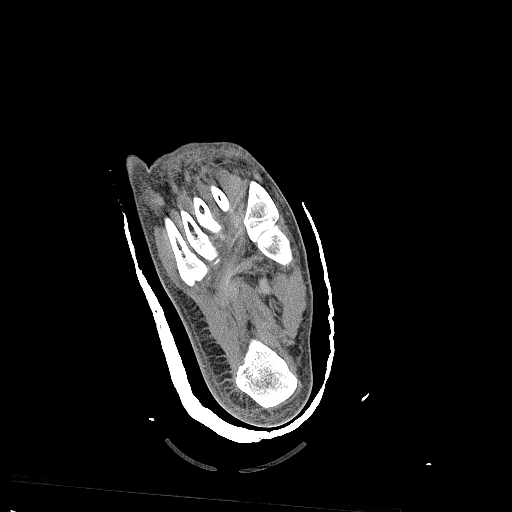
[im 15/44  bone]
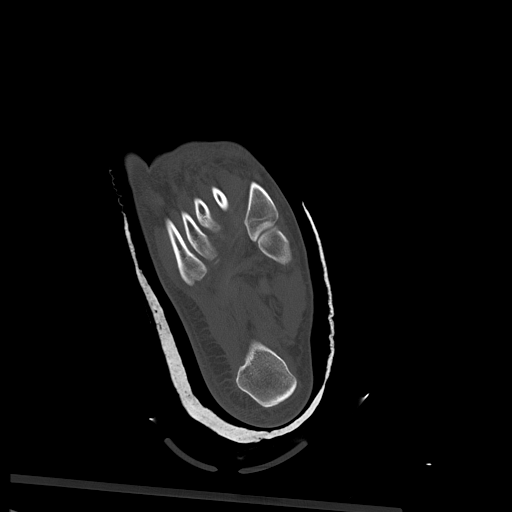
[im 29/44  bone]
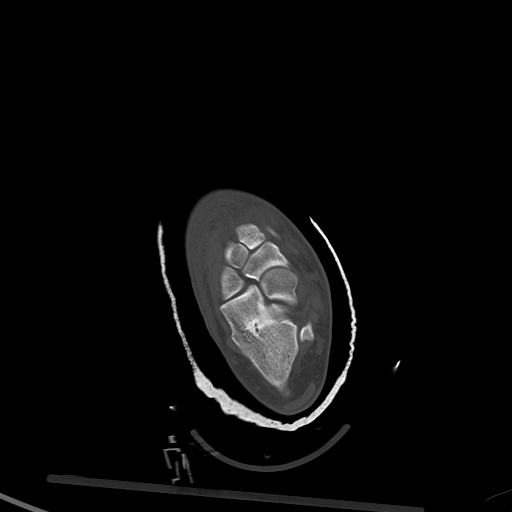

[13 of 20 positions shown; findings below may reference images not displayed]

FINDINGS: Bones/Joint/Cartilage

As seen on the comparison plain films, the patient has a comminuted
fracture through the medial periphery of the navicular. The fracture
involves the medial 0.9 cm of the navicular. The fracture site
includes the entire articular surface of the navicular at its
articulation with the medial cuneiform. The medial cuneiform is
proximally subluxed 0.8 cm.

2-3 tiny bone fragments are seen between the middle and medial
cuneiforms. Tiny bone fragments are also identified at the base of
the second metatarsal. The interface between the middle and medial
cuneiform is abnormally widened at 0.6 cm and the interface between
the medial cuneiform and base of the second metatarsal is also
abnormally widened most consistent with Lisfranc ligament tear.

Fractures of the necks of the second and third metatarsals are
identified as seen on the comparison plain films. The fracture of
the second metatarsal neck extends from the proximal metaphysis on
the medial side through the distal metaphysis on the lateral side.
The dorsal aspect of the metatarsal neck is impacted 0.3 cm and the
distal fracture fragment is medially displaced 0.2 cm. The fracture
extends through the articular surface of the second metatarsal head
inferiorly where a slight step-off is identified. Fracture of the
third metatarsal neck is impacted approximately 0.2 cm. The
articular surface of the second metatarsal head is preserved.

Ligaments

Suboptimally assessed by CT. As noted above, findings highly
suspicious for Lisfranc ligament tear are identified.

Muscles and Tendons

Intact. The patient's tibialis posterior tendon inserts on fracture
fragments of the navicular.

Soft tissues

Subcutaneous edema and contusion are present about the patient's
fractures.
IMPRESSION: Comminuted fracture of the medial periphery of the navicular bone
involves the insertion of the tibialis posterior and portion of the
navicular which articulates with the medial cuneiform. The fracture
results in medial and proximal subluxation of medial cuneiform.

Findings most consistent with Lisfranc ligament tear.

Fractures of the distal second and third metatarsals.

3-dimensional CT images were rendered by post-processing of the
original CT data at the CT scanner. The 3-dimensional CT images were
interpreted, and findings were reported in the accompanying complete
CT report for this study.

## 2019-12-26 ENCOUNTER — Ambulatory Visit: Payer: Self-pay | Attending: Internal Medicine
